# Patient Record
Sex: Male | Born: 1950 | Race: Black or African American | Hispanic: No | Marital: Married | Smoking: Former smoker
Health system: Southern US, Community
[De-identification: ages and names within clinical notes are randomized; demographics above are authoritative.]

## PROBLEM LIST (undated history)

## (undated) DIAGNOSIS — F1910 Other psychoactive substance abuse, uncomplicated: Secondary | ICD-10-CM

## (undated) DIAGNOSIS — I10 Essential (primary) hypertension: Secondary | ICD-10-CM

## (undated) DIAGNOSIS — E119 Type 2 diabetes mellitus without complications: Secondary | ICD-10-CM

## (undated) DIAGNOSIS — E7800 Pure hypercholesterolemia, unspecified: Secondary | ICD-10-CM

## (undated) DIAGNOSIS — I639 Cerebral infarction, unspecified: Secondary | ICD-10-CM

## (undated) DIAGNOSIS — I739 Peripheral vascular disease, unspecified: Secondary | ICD-10-CM

## (undated) HISTORY — PX: FEMORAL ARTERY STENT: SHX1583

---

## 2020-06-18 ENCOUNTER — Encounter: Payer: Self-pay | Admitting: Emergency Medicine

## 2020-06-18 ENCOUNTER — Observation Stay: Payer: Medicare Other

## 2020-06-18 ENCOUNTER — Other Ambulatory Visit: Payer: Self-pay

## 2020-06-18 ENCOUNTER — Inpatient Hospital Stay
Admission: EM | Admit: 2020-06-18 | Discharge: 2020-06-21 | DRG: 064 | Disposition: A | Discharge status: Home / Self Care | Payer: Medicare Other | Attending: Internal Medicine | Admitting: Internal Medicine

## 2020-06-18 ENCOUNTER — Emergency Department: Payer: Medicare Other

## 2020-06-18 DIAGNOSIS — E7800 Pure hypercholesterolemia, unspecified: Secondary | ICD-10-CM | POA: Diagnosis present

## 2020-06-18 DIAGNOSIS — Z7902 Long term (current) use of antithrombotics/antiplatelets: Secondary | ICD-10-CM

## 2020-06-18 DIAGNOSIS — G9341 Metabolic encephalopathy: Secondary | ICD-10-CM | POA: Diagnosis present

## 2020-06-18 DIAGNOSIS — R29706 NIHSS score 6: Secondary | ICD-10-CM | POA: Diagnosis present

## 2020-06-18 DIAGNOSIS — R739 Hyperglycemia, unspecified: Secondary | ICD-10-CM

## 2020-06-18 DIAGNOSIS — G8321 Monoplegia of upper limb affecting right dominant side: Secondary | ICD-10-CM | POA: Diagnosis present

## 2020-06-18 DIAGNOSIS — R2990 Unspecified symptoms and signs involving the nervous system: Secondary | ICD-10-CM

## 2020-06-18 DIAGNOSIS — F1210 Cannabis abuse, uncomplicated: Secondary | ICD-10-CM | POA: Diagnosis present

## 2020-06-18 DIAGNOSIS — Z79899 Other long term (current) drug therapy: Secondary | ICD-10-CM

## 2020-06-18 DIAGNOSIS — E1169 Type 2 diabetes mellitus with other specified complication: Secondary | ICD-10-CM | POA: Diagnosis present

## 2020-06-18 DIAGNOSIS — Z9114 Patient's other noncompliance with medication regimen: Secondary | ICD-10-CM

## 2020-06-18 DIAGNOSIS — R471 Dysarthria and anarthria: Secondary | ICD-10-CM | POA: Diagnosis present

## 2020-06-18 DIAGNOSIS — Z7151 Drug abuse counseling and surveillance of drug abuser: Secondary | ICD-10-CM

## 2020-06-18 DIAGNOSIS — F1410 Cocaine abuse, uncomplicated: Secondary | ICD-10-CM | POA: Diagnosis present

## 2020-06-18 DIAGNOSIS — I639 Cerebral infarction, unspecified: Secondary | ICD-10-CM

## 2020-06-18 DIAGNOSIS — Z20822 Contact with and (suspected) exposure to covid-19: Secondary | ICD-10-CM | POA: Diagnosis present

## 2020-06-18 DIAGNOSIS — Z87891 Personal history of nicotine dependence: Secondary | ICD-10-CM

## 2020-06-18 DIAGNOSIS — I10 Essential (primary) hypertension: Secondary | ICD-10-CM | POA: Diagnosis not present

## 2020-06-18 DIAGNOSIS — F1010 Alcohol abuse, uncomplicated: Secondary | ICD-10-CM | POA: Diagnosis present

## 2020-06-18 DIAGNOSIS — E1165 Type 2 diabetes mellitus with hyperglycemia: Secondary | ICD-10-CM | POA: Diagnosis present

## 2020-06-18 DIAGNOSIS — E1151 Type 2 diabetes mellitus with diabetic peripheral angiopathy without gangrene: Secondary | ICD-10-CM | POA: Diagnosis present

## 2020-06-18 DIAGNOSIS — I700 Atherosclerosis of aorta: Secondary | ICD-10-CM | POA: Diagnosis present

## 2020-06-18 DIAGNOSIS — I672 Cerebral atherosclerosis: Secondary | ICD-10-CM | POA: Diagnosis present

## 2020-06-18 DIAGNOSIS — I63412 Cerebral infarction due to embolism of left middle cerebral artery: Secondary | ICD-10-CM | POA: Diagnosis not present

## 2020-06-18 DIAGNOSIS — H9190 Unspecified hearing loss, unspecified ear: Secondary | ICD-10-CM | POA: Diagnosis present

## 2020-06-18 DIAGNOSIS — F1011 Alcohol abuse, in remission: Secondary | ICD-10-CM | POA: Diagnosis present

## 2020-06-18 DIAGNOSIS — I739 Peripheral vascular disease, unspecified: Secondary | ICD-10-CM | POA: Diagnosis present

## 2020-06-18 DIAGNOSIS — R4701 Aphasia: Secondary | ICD-10-CM | POA: Diagnosis present

## 2020-06-18 DIAGNOSIS — E876 Hypokalemia: Secondary | ICD-10-CM | POA: Diagnosis present

## 2020-06-18 DIAGNOSIS — N400 Enlarged prostate without lower urinary tract symptoms: Secondary | ICD-10-CM | POA: Diagnosis present

## 2020-06-18 DIAGNOSIS — Z87438 Personal history of other diseases of male genital organs: Secondary | ICD-10-CM | POA: Diagnosis present

## 2020-06-18 DIAGNOSIS — E785 Hyperlipidemia, unspecified: Secondary | ICD-10-CM | POA: Diagnosis present

## 2020-06-18 HISTORY — DX: Pure hypercholesterolemia, unspecified: E78.00

## 2020-06-18 HISTORY — DX: Essential (primary) hypertension: I10

## 2020-06-18 HISTORY — DX: Peripheral vascular disease, unspecified: I73.9

## 2020-06-18 LAB — CBG MONITORING, ED
Glucose-Capillary: 189 mg/dL — ABNORMAL HIGH (ref 70–99)
Glucose-Capillary: 110 mg/dL — ABNORMAL HIGH (ref 70–99)

## 2020-06-18 LAB — COMPREHENSIVE METABOLIC PANEL
ALT: 20 U/L (ref 0–44)
AST: 24 U/L (ref 15–41)
Albumin: 4.5 g/dL (ref 3.5–5.0)
Alkaline Phosphatase: 52 U/L (ref 38–126)
Anion gap: 9 (ref 5–15)
BUN: 19 mg/dL (ref 8–23)
CO2: 24 mmol/L (ref 22–32)
Calcium: 9.3 mg/dL (ref 8.9–10.3)
Chloride: 104 mmol/L (ref 98–111)
Creatinine, Ser: 1.02 mg/dL (ref 0.61–1.24)
GFR, Estimated: >60 mL/min (ref >60)
Glucose, Bld: 211 mg/dL — ABNORMAL HIGH (ref 70–99)
Potassium: 3.7 mmol/L (ref 3.5–5.1)
Sodium: 137 mmol/L (ref 135–145)
Total Bilirubin: 0.8 mg/dL (ref 0.3–1.2)
Total Protein: 7.1 g/dL (ref 6.5–8.1)

## 2020-06-18 LAB — DIFFERENTIAL
Abs Immature Granulocytes: 0 10*3/uL (ref 0.00–0.07)
Basophils Absolute: 0 10*3/uL (ref 0.0–0.1)
Basophils Relative: 1 %
Eosinophils Absolute: 0.1 10*3/uL (ref 0.0–0.5)
Eosinophils Relative: 2 %
Immature Granulocytes: 0 %
Lymphocytes Relative: 16 %
Lymphs Abs: 0.6 10*3/uL — ABNORMAL LOW (ref 0.7–4.0)
Monocytes Absolute: 0.5 10*3/uL (ref 0.1–1.0)
Monocytes Relative: 13 %
Neutro Abs: 2.6 10*3/uL (ref 1.7–7.7)
Neutrophils Relative %: 68 %

## 2020-06-18 LAB — RESP PANEL BY RT-PCR (FLU A&B, COVID) ARPGX2
Influenza A by PCR: NEGATIVE
Influenza B by PCR: NEGATIVE
SARS Coronavirus 2 by RT PCR: NEGATIVE

## 2020-06-18 LAB — APTT: aPTT: 25 seconds (ref 24–36)

## 2020-06-18 LAB — PROTIME-INR
INR: 1.1 (ref 0.8–1.2)
Prothrombin Time: 13.4 seconds (ref 11.4–15.2)

## 2020-06-18 LAB — MAGNESIUM: Magnesium: 2 mg/dL (ref 1.7–2.4)

## 2020-06-18 LAB — GLUCOSE, CAPILLARY: Glucose-Capillary: 111 mg/dL — ABNORMAL HIGH (ref 70–99)

## 2020-06-18 LAB — CBC
HCT: 46.1 % (ref 39.0–52.0)
Hemoglobin: 14.9 g/dL (ref 13.0–17.0)
MCH: 28.8 pg (ref 26.0–34.0)
MCHC: 32.3 g/dL (ref 30.0–36.0)
MCV: 89.2 fL (ref 80.0–100.0)
Platelets: 194 10*3/uL (ref 150–400)
RBC: 5.17 MIL/uL (ref 4.22–5.81)
RDW: 12.5 % (ref 11.5–15.5)
WBC: 3.7 10*3/uL — ABNORMAL LOW (ref 4.0–10.5)
nRBC: 0 % (ref 0.0–0.2)

## 2020-06-18 LAB — PHOSPHORUS: Phosphorus: 3.6 mg/dL (ref 2.5–4.6)

## 2020-06-18 LAB — URINALYSIS, COMPLETE (UACMP) WITH MICROSCOPIC
Bacteria, UA: NONE SEEN
Bilirubin Urine: NEGATIVE
Glucose, UA: >=500 mg/dL — AB
Hgb urine dipstick: NEGATIVE
Ketones, ur: NEGATIVE mg/dL
Leukocytes,Ua: NEGATIVE
Nitrite: NEGATIVE
Protein, ur: NEGATIVE mg/dL
Specific Gravity, Urine: 1.025 (ref 1.005–1.030)
pH: 5 (ref 5.0–8.0)

## 2020-06-18 LAB — TROPONIN I (HIGH SENSITIVITY)
Troponin I (High Sensitivity): 14 ng/L (ref <18)
Troponin I (High Sensitivity): 17 ng/L (ref <18)

## 2020-06-18 LAB — ETHANOL: Alcohol, Ethyl (B): <10 mg/dL (ref <10)

## 2020-06-18 LAB — HEMOGLOBIN A1C
Hgb A1c MFr Bld: 7.5 % — ABNORMAL HIGH (ref 4.8–5.6)
Mean Plasma Glucose: 168.55 mg/dL

## 2020-06-18 MED ORDER — FOLIC ACID 1 MG PO TABS
1.0000 mg | ORAL_TABLET | Freq: Every day | ORAL | Status: DC
  Administered 2020-06-18 – 2020-06-21 (×4): 1 mg via ORAL
  Filled 2020-06-18 (×4): qty 1

## 2020-06-18 MED ORDER — SENNOSIDES-DOCUSATE SODIUM 8.6-50 MG PO TABS
1.0000 | ORAL_TABLET | Freq: Two times a day (BID) | ORAL | Status: DC
  Administered 2020-06-18 – 2020-06-21 (×7): 1 via ORAL
  Filled 2020-06-18 (×6): qty 1

## 2020-06-18 MED ORDER — LORAZEPAM 2 MG/ML IJ SOLN: 1.0000 mg | INTRAMUSCULAR | Status: DC | PRN

## 2020-06-18 MED ORDER — ACETAMINOPHEN 325 MG PO TABS: 325.0000 mg | ORAL_TABLET | ORAL | Status: DC | PRN

## 2020-06-18 MED ORDER — ATORVASTATIN CALCIUM 20 MG PO TABS
80.0000 mg | ORAL_TABLET | Freq: Every day | ORAL | Status: DC
  Administered 2020-06-18 – 2020-06-21 (×4): 80 mg via ORAL
  Filled 2020-06-18 (×4): qty 4

## 2020-06-18 MED ORDER — ADULT MULTIVITAMIN W/MINERALS CH
1.0000 | ORAL_TABLET | Freq: Every day | ORAL | Status: DC
  Administered 2020-06-19 – 2020-06-21 (×3): 1 via ORAL
  Filled 2020-06-18 (×3): qty 1

## 2020-06-18 MED ORDER — LABETALOL HCL 5 MG/ML IV SOLN: 5.0000 mg | INTRAVENOUS | Status: DC | PRN

## 2020-06-18 MED ORDER — THIAMINE HCL 100 MG PO TABS
100.0000 mg | ORAL_TABLET | Freq: Every day | ORAL | Status: DC
  Administered 2020-06-18 – 2020-06-21 (×4): 100 mg via ORAL
  Filled 2020-06-18 (×4): qty 1

## 2020-06-18 MED ORDER — ACETAMINOPHEN 650 MG RE SUPP: 650.0000 mg | RECTAL | Status: DC | PRN

## 2020-06-18 MED ORDER — ACETAMINOPHEN 160 MG/5ML PO SOLN
650.0000 mg | ORAL | Status: DC | PRN
  Filled 2020-06-18: qty 20.3

## 2020-06-18 MED ORDER — STROKE: EARLY STAGES OF RECOVERY BOOK
Freq: Once | Status: DC
  Filled 2020-06-18: qty 1

## 2020-06-18 MED ORDER — INSULIN ASPART 100 UNIT/ML ~~LOC~~ SOLN
0.0000 [IU] | SUBCUTANEOUS | Status: DC
  Administered 2020-06-19 – 2020-06-21 (×6): 2 [IU] via SUBCUTANEOUS
  Filled 2020-06-18 (×6): qty 1

## 2020-06-18 MED ORDER — CLOPIDOGREL BISULFATE 75 MG PO TABS: 75.0000 mg | ORAL_TABLET | Freq: Every day | ORAL | Status: DC

## 2020-06-18 MED ORDER — THIAMINE HCL 100 MG/ML IJ SOLN: 100.0000 mg | Freq: Every day | INTRAMUSCULAR | Status: DC

## 2020-06-18 MED ORDER — ASPIRIN 300 MG RE SUPP
300.0000 mg | Freq: Every day | RECTAL | Status: DC
  Filled 2020-06-18 (×3): qty 1

## 2020-06-18 MED ORDER — ASPIRIN 81 MG PO CHEW
81.0000 mg | CHEWABLE_TABLET | Freq: Every day | ORAL | Status: DC
  Administered 2020-06-18 – 2020-06-21 (×4): 81 mg via ORAL
  Filled 2020-06-18 (×4): qty 1

## 2020-06-18 MED ORDER — IOHEXOL 350 MG/ML SOLN
75.0000 mL | Freq: Once | INTRAVENOUS | Status: AC | PRN
  Administered 2020-06-18: 75 mL via INTRAVENOUS

## 2020-06-18 MED ORDER — THIAMINE HCL 100 MG/ML IJ SOLN
100.0000 mg | Freq: Once | INTRAMUSCULAR | Status: AC
  Administered 2020-06-18: 100 mg via INTRAVENOUS
  Filled 2020-06-18: qty 2

## 2020-06-18 MED ORDER — LORAZEPAM 1 MG PO TABS
1.0000 mg | ORAL_TABLET | ORAL | Status: DC | PRN
  Administered 2020-06-19: 1 mg via ORAL
  Filled 2020-06-18: qty 1

## 2020-06-18 NOTE — ED Triage Notes (Signed)
Pt to ED via POV with wife, pt's wife states symptoms started Friday, pt's wife reports LKW was Wednesday, noted symptoms on Friday. Pt's wife reports episodes where patient does not understand what she is saying, also reports some difficulty using R arm.   Pt with some noted confusion when asked to perform tasks, no drift noted, grip strength equal bilaterally, no numbness when touched, pt alert and oriented to place, month, disoriented to year.

## 2020-06-18 NOTE — TOC Initial Note (Signed)
Transition of Care [facility]) - Initial/Assessment Note    Patient Details  Name: Micheal Owens MRN: [id number] Date of Birth: [birth year]  [facility] CM/SW Contact:    Berenice Bouton, LCSW Phone Number: 06/18/2020, 4:14 PM  Clinical Narrative:    CSW met with patient at bedside to discussed discharge plan. Social worker explained to the patient reason for the consult. CSW explain HIPPA.  Substance abuse counseling.   The patient is a 70 year old male who presented to [facility] ED with complaints of confusion and right upper extremity weakness. Wife drove patient to the hospital via private vehicle. Patient self-disclosed current use of crack cocaine, marijuana use, and alcohol consumption.  Social worker collaborated with patient's wife who noted that the patient is currently under the care [facility].  Has follow-up appointment with PCP on 06/28/2020.   Patient was provided with the following resources: [facility] for Substance Abuse and [place] [facility] [employer]. Substance abuse counseling provided along with risk factors associated with substance use and total health.  Patient's status is Obs. CSW will continue to follow this patient should further needs arise.                Expected Discharge Plan: Home/Self Care Barriers to Discharge: Continued Medical Work up   Patient Goals and CMS Choice Patient states their goals for this hospitalization and ongoing recovery are:: "Go home to my wife."      Expected Discharge Plan and Services Expected Discharge Plan: Home/Self Care       Living arrangements for the past 2 months: Single Family Home                           Prior Living Arrangements/Services Living arrangements for the past 2 months: Single Family Home   Patient language and need for interpreter reviewed:: No Do you feel safe going back to the place where you live?: Yes      Need for Family Participation in  Patient Care: No (Comment) Care giver support system in place?: Yes (comment)   Criminal Activity/Legal Involvement Pertinent to Current Situation/Hospitalization: No - Comment as needed  Activities of Daily Living      Permission Sought/Granted Permission sought to share information with : Case Manager,Facility Contact Representative,Family Supports Permission granted to share information with : Yes, Verbal Permission Granted  Share Information with NAME: Destine Ambroise [phone]           Emotional Assessment Appearance:: Appears older than stated age Attitude/Demeanor/Rapport: Gracious Affect (typically observed): Accepting Orientation: : Oriented to Place,Oriented to Self,Oriented to Situation Alcohol / Substance Use: Alcohol Use,Illicit Drugs (Crack cocaine, alcohol, marijuana) Psych Involvement: No (comment)  Admission diagnosis:  Hyperglycemia [R73.9] Stroke [facility]) [I63.9] Dysarthria [R47.1] Cerebrovascular accident (CVA), unspecified mechanism ([place]) [I63.9] Patient Active Problem List   Diagnosis Date Noted  . HTN (hypertension) 06/18/2020  . Type 2 diabetes mellitus with hyperlipidemia ([place]) 06/18/2020  . PAD (peripheral artery disease) ([place]) 06/18/2020  . History of BPH 06/18/2020  . History of alcohol abuse 06/18/2020  . Stroke-like episode 06/18/2020  . Stroke [facility]) 06/18/2020   PCP:  Healthcare, [place]:   [id number] [id number]: [phone]: [phone]    Social Determinants of Health (SDOH) Interventions    Readmission Risk Interventions No flowsheet data found.

## 2020-06-18 NOTE — Consult Note (Signed)
[facility] Neurology Consultation  Reason for Consult: stroke Referring Physician: Dr Sedalia Muta, [facility] hospitalist  CC: Confusion, right arm weakness  History is obtained from: Patient's wife, chart  HPI: Micheal Owens is a 70 y.o. male-medical history of DM2, hypercholesterolemia, hypertension, peripheral vascular disease on Plavix at home, with unknown compliance, alcohol abuse and cocaine abuse, presenting for evaluation of confusion and right-sided weakness with last known well at some point Thursday, 06/15/2020. The wife reports that he is retired, drinks a lot of alcohol-at least 1 pint of liquor with some beer as well every day and he she recently found out that he also has been doing cocaine.  He appears to be off of his baseline to her because he is acting confused, his words not making sense.  This started to become very noticeable sometime on Thursday, 06/15/2020 but he would refuse to come to the hospital.  Today after church, she drove her to the hospital for evaluation.  She also reports that he told her that he had stopped drinking alcohol cold [place] 1 to 2 weeks ago.  She does not know if that is really true.  No reported seizures. He is unable to provide reliable history.  Reason the wife brought him today was the confusion and word finding issues as well as she also felt that he was unable to use his right side as well as the left side.   LKW: Unclear-advanced sometime on Thursday, 06/15/2020 tpa given?: no, outside the window Premorbid modified Rankin scale (mRS): 0  ROS: Performed and negative except as noted in HPI  Past Medical History:  Diagnosis Date  . High cholesterol   . Hypertension   . PVD (peripheral vascular disease) (HCC)   Diabetes type 2  No family history on file.  Social History:   reports that he has quit smoking. He has never used smokeless tobacco. He reports previous alcohol use. He reports previous drug use.  Medications  Current Facility-Administered  Medications:  .   stroke: mapping our early stages of recovery book, , Does not apply, Once, Cox, Amy N, DO .  acetaminophen (TYLENOL) tablet 325 mg, 325 mg, Oral, Q4H PRN **OR** acetaminophen (TYLENOL) 160 MG/5ML solution 650 mg, 650 mg, Per Tube, Q4H PRN **OR** acetaminophen (TYLENOL) suppository 650 mg, 650 mg, Rectal, Q4H PRN, Cox, Amy N, DO .  insulin aspart (novoLOG) injection 0-15 Units, 0-15 Units, Subcutaneous, Q4H, Cox, Amy N, DO .  senna-docusate (Senokot-S) tablet 1 tablet, 1 tablet, Oral, BID, Cox, Amy N, DO No current outpatient medications on file. Complete list of home medications not available but wife had a few medications on her which included atorvastatin, Plavix 75, and amlodipine.  Care everywhere review also shows that he is on lisinopril, sildenafil, tamsulosin 0.4 nightly  Exam: Current vital signs: BP (!) 148/86 (BP Location: Left Arm)   Pulse 70   Temp 98.1 F (36.7 C) (Oral)   Resp 20   Ht 5\' 8"  (1.727 m)   Wt 84.8 kg   SpO2 97%   BMI 28.43 kg/m  Vital signs in last 24 hours: Temp:  [98.1 F (36.7 C)] 98.1 F (36.7 C) (03/13 1214) Pulse Rate:  [70] 70 (03/13 1214) Resp:  [20] 20 (03/13 1214) BP: (148)/(86) 148/86 (03/13 1214) SpO2:  [97 %] 97 % (03/13 1214) Weight:  [84.8 kg] 84.8 kg (03/13 1215) General: Awake alert in no distress HEENT: Normocephalic atraumatic Lungs: Clear Cardiovascular: Regular rhythm Abdomen soft nondistended nontender Extremities warm well perfused with  no edema Neurological exam Is awake alert oriented to self. He was able to say his name. On asking his age that she said 24. He was unable to name most of the objects on the stroke cards with the exception of chair and keep. He was unable to describe the picture and had multiple word substitutions and inability to grasp the context. He was able to follow simple commands and mimic. Cranial nerves: Pupils equal round react light, extract movements intact, visual fields full,  face appears grossly symmetric with questionable mild right-sided nasolabial fold flattening, auditory acuity diminished bilaterally, tongue deviates to the right on protruding completely out.  Palate midline. Motor exam: All 4 extremities are antigravity without drift but he does have pronator drift on the right upper extremity.  Strength in all 4 extremities is nearly symmetric. Sensory exam: Intact to touch Coordination: No dysmetria Gait testing deferred NIH stroke scale 1a Level of Conscious.: 0 1b LOC Questions: 2 1c LOC Commands: 0 2 Best Gaze: 0 3 Visual: 0 4 Facial Palsy: 1 5a Motor Arm - left: 0 5b Motor Arm - Right: 0 6a Motor Leg - Left: 0 6b Motor Leg - Right: 0 7 Limb Ataxia: 0 8 Sensory: 0 9 Best Language: 2 10 Dysarthria: 1 11 Extinct. and Inatten.: 0 TOTAL: 6  Labs I have reviewed labs in epic and the results pertinent to this consultation are:  CBC    Component Value Date/Time   WBC 3.7 (L) 06/18/2020 1227   RBC 5.17 06/18/2020 1227   HGB 14.9 06/18/2020 1227   HCT 46.1 06/18/2020 1227   PLT 194 06/18/2020 1227   MCV 89.2 06/18/2020 1227   MCH 28.8 06/18/2020 1227   MCHC 32.3 06/18/2020 1227   RDW 12.5 06/18/2020 1227   LYMPHSABS 0.6 (L) 06/18/2020 1227   MONOABS 0.5 06/18/2020 1227   EOSABS 0.1 06/18/2020 1227   BASOSABS 0.0 06/18/2020 1227   CMP     Component Value Date/Time   NA 137 06/18/2020 1227   K 3.7 06/18/2020 1227   CL 104 06/18/2020 1227   CO2 24 06/18/2020 1227   GLUCOSE 211 (H) 06/18/2020 1227   BUN 19 06/18/2020 1227   CREATININE 1.02 06/18/2020 1227   CALCIUM 9.3 06/18/2020 1227   PROT 7.1 06/18/2020 1227   ALBUMIN 4.5 06/18/2020 1227   AST 24 06/18/2020 1227   ALT 20 06/18/2020 1227   ALKPHOS 52 06/18/2020 1227   BILITOT 0.8 06/18/2020 1227   GFRNONAA >60 06/18/2020 1227   Imaging I have reviewed the images obtained:  CT-scan of the brain-noncontrast-hypodensities indicating potentially subacute infarct in the mid  left frontal mid left occipital and superior posterior right centrum semiovale.  The left-sided distribution seems more watershed rather than [employer] radiology read is more concerning for embolic phenomenon given the right hemispheric involvement as well which I do not completely disagree with.  Assessment:  70 year old man with above past medical history including depressiveness abuse, hypertension, hyperlipidemia, and alcohol abuse presenting for 2 to 3 days worth of increasing confusion and difficulty with forming his words along with since Thursday, more difficulty using his right hand. On my exam he is definitely more expressively aphasic and receptively aphasic. He does have subtle right nasolabial fold flattening and right upper extremity pronator drift. CT head concerning for embolic-looking infarcts but to me the appearance of the left-sided distribution could be a watershed pattern rather than embolic.  There are definitely right-sided hypodensities as well which could point  to embolic etiology. Given his cocaine use, embolic etiology versus vasospasm is also likely. Concomitant small vessel disease due to risk factors could also be the etiology. Last but not the least-given polysubstance abuse history, concern for septic emboli also should remain. Other differentials could include Wernicke's given heavy alcohol use and recent history of possible discontinuation.  Impression:  Acute ischemic stroke-evaluate for cardioembolic versus atheroembolic versus large vessel occlusion etiology  Not a candidate for TPA or intervention due to last known normal being at least 3 days ago.  Polysubstance abuse  Hypertension, hyperlipidemia  Alcohol abuse-recently quit-no reported seizure activity.   Recommendations:  Admit to hospitalist  Frequent neurochecks  MRI of the brain without contrast stat  CTA head and neck stat to look for left carotid stenosis or occlusion-which  would favor watershed etiology of strokes.  2D echo  A1c  Lipid panel  For now aspirin 81 only.  Given his polysubstance abuse history, this is somewhat related to septic emboli or infective endocarditis, he would be at high risk for bleed if he is on dual antiplatelets or anticoagulants.  CTA above to evaluate for mycotic aneurysms as well  Based on vessel imaging, prophylactic antiplatelet treatment would be recommended.  If atrial fibrillation is revealed on telemetry, will need anticoagulation in the long run  N.p.o. until cleared by bedside swallow valuation  PT/OT/speech therapy  CIWA protocol  Check thiamine and then replete high-dose thiamine IV followed by PO  U tox  Inpatient neurology will follow with you  -- Milon Dikes, MD Neurologist [facility] Pager: [phone]

## 2020-06-18 NOTE — Progress Notes (Signed)
Pt arrived to 1A from ED. VS obtained and in the chart. Pt oriented to room and call bell is in reach. All questions and concerns answered at this time.

## 2020-06-18 NOTE — ED Provider Notes (Signed)
[facility] Emergency Department Provider Note  ____________________________________________   Event Date/Time   First MD Initiated Contact with Patient 06/18/20 1305     (approximate)  I have reviewed the triage vital signs and the nursing notes.   HISTORY  Chief Complaint Altered Mental Status   HPI Micheal Owens is a 70 y.o. male with a past medical history of HTN, HDL, PVD on Plavix and tobacco and THC use who presents accompanied by wife for assessment of some weakness in his right arm and difficulty with speech that his wife first noticed on 3 01/08/2010 she is not exactly sure.  Patient denies any acute pain or recent injuries although is unclear how reliable his history is as he is unable to state the year.  Patient's wife is at bedside and states this is very abnormal for him.  She has not witnessed any falls or injuries, cough, shortness of breath, vomiting, diarrhea and does not think the patient has been complaining of any other sick symptoms.  No other history is immediately available on arrival.  No history of stroke.         Past Medical History:  Diagnosis Date  . High cholesterol   . Hypertension   . PVD (peripheral vascular disease) [facility])     Patient Active Problem List   Diagnosis Date Noted  . HTN (hypertension) 06/18/2020  . Type 2 diabetes mellitus with hyperlipidemia (HCC) 06/18/2020  . PAD (peripheral artery disease) (HCC) 06/18/2020  . History of BPH 06/18/2020  . History of alcohol abuse 06/18/2020  . Stroke-like episode 06/18/2020  . Stroke (HCC) 06/18/2020    Prior to Admission medications   Not on File    Allergies Patient has no known allergies.  No family history on file.  Social History Social History   Tobacco Use  . Smoking status: Former Games developer  . Smokeless tobacco: Never Used  Substance Use Topics  . Alcohol use: Not Currently  . Drug use: Not Currently    Review of Systems  Review of Systems   Unable to perform ROS: Mental status change  Neurological: Positive for focal weakness ( R arm).      ____________________________________________   PHYSICAL EXAM:  VITAL SIGNS: ED Triage Vitals  Enc Vitals Group     BP 06/18/20 1214 (!) 148/86     Pulse Rate 06/18/20 1214 70     Resp 06/18/20 1214 20     Temp 06/18/20 1214 98.1 F (36.7 C)     Temp Source 06/18/20 1214 Oral     SpO2 06/18/20 1214 97 %     Weight 06/18/20 1215 187 lb (84.8 kg)     Height 06/18/20 1215 5\' 8"  (1.727 m)     Head Circumference --      Peak Flow --      Pain Score 06/18/20 1214 0     Pain Loc --      Pain Edu? --      Excl. in GC? --    Vitals:   06/18/20 1214  BP: (!) 148/86  Pulse: 70  Resp: 20  Temp: 98.1 F (36.7 C)  SpO2: 97%   Physical Exam Vitals and nursing note reviewed.  Constitutional:      Appearance: He is well-developed.  HENT:     Head: Normocephalic and atraumatic.     Right Ear: External ear normal.     Left Ear: External ear normal.     Nose: Nose normal.  Eyes:     Conjunctiva/sclera: Conjunctivae normal.  Cardiovascular:     Rate and Rhythm: Normal rate and regular rhythm.     Heart sounds: No murmur heard.   Pulmonary:     Effort: Pulmonary effort is normal. No respiratory distress.     Breath sounds: Normal breath sounds.  Abdominal:     Palpations: Abdomen is soft.     Tenderness: There is no abdominal tenderness.  Musculoskeletal:     Cervical back: Neck supple.  Skin:    General: Skin is warm and dry.  Neurological:     Mental Status: He is alert. He is confused.     Cranial Nerves: Dysarthria present.  Psychiatric:        Mood and Affect: Mood normal.     No clear finger dysmetria or pronator drift although patient is unable to hold his palm facing upwards on testing pronator drifting.  He seems to have symmetric strength in his bilateral upper extremities and has symmetric strength in his lower extremities.  Sensation is intact to light  touch lower extremities.  PERRLA.  EOMI. ____________________________________________   LABS (all labs ordered are listed, but only abnormal results are displayed)  Labs Reviewed  CBC - Abnormal; Notable for the following components:      Result Value   WBC 3.7 (*)    All other components within normal limits  DIFFERENTIAL - Abnormal; Notable for the following components:   Lymphs Abs 0.6 (*)    All other components within normal limits  COMPREHENSIVE METABOLIC PANEL - Abnormal; Notable for the following components:   Glucose, Bld 211 (*)    All other components within normal limits  CBG MONITORING, ED - Abnormal; Notable for the following components:   Glucose-Capillary 189 (*)    All other components within normal limits  RESP PANEL BY RT-PCR (FLU A&B, COVID) ARPGX2  PROTIME-INR  APTT  HEMOGLOBIN A1C  URINALYSIS, COMPLETE (UACMP) WITH MICROSCOPIC  ETHANOL  URINE DRUG SCREEN, QUALITATIVE ([facility] ONLY)  URINALYSIS, ROUTINE W REFLEX MICROSCOPIC  TROPONIN I (HIGH SENSITIVITY)   ____________________________________________  EKG  Sinus rhythm with ventricular rate of 80, normal axis, unremarkable intervals and some nonspecific ST changes in leads II, aVF, V3, V4, and V5. ____________________________________________  RADIOLOGY  ED MD interpretation: CT head shows no evidence of acute intracranial hemorrhage but does show evidence of multiple likely acute ischemic infarcts.  Official radiology report(s): CT HEAD WO CONTRAST  Result Date: 06/18/2020 CLINICAL DATA:  Altered mental status EXAM: CT HEAD WITHOUT CONTRAST TECHNIQUE: Contiguous axial images were obtained from the base of the skull through the vertex without intravenous contrast. COMPARISON:  None. FINDINGS: Brain: Ventricles and sulci are normal in size and configuration. There is no intracranial mass, hemorrhage, extra-axial fluid collection, midline shift. There is decreased attenuation in the mid left frontal lobe  consistent with a recent and potentially acute infarct. A similar appearing area of decreased attenuation is noted in the mid left occipital lobe, concerning for recent and potentially acute infarct. There is a focus of decreased attenuation in the posterior right centrum semiovale which may represent a recent and possibly acute white matter infarct in this area. Vascular: No hyperdense vessel. There is calcification in each distal vertebral artery and carotid siphon region. Skull: Bony calvarium appears intact. Sinuses/Orbits: Mucosal thickening noted in several ethmoid air cells and in the anterior sphenoid sinus regions. Visualized orbits appear symmetric bilaterally. Other: Mastoid air cells are clear. IMPRESSION: Concern for acute infarcts in  the mid left frontal, mid left occipital, and superior posterior right centrum semiovale. This distribution of potential acute infarcts suggests underlying embolic phenomenon. No mass or hemorrhage evident. Multiple foci of arterial vascular calcification noted. There is mucosal thickening in several paranasal sinus regions. Electronically Signed   By: Bretta Bang III M.D.   On: 06/18/2020 12:56    ____________________________________________   PROCEDURES  Procedure(s) performed (including Critical Care):  .1-3 Lead EKG Interpretation Performed by: Gilles Chiquito, MD Authorized by: Gilles Chiquito, MD     Interpretation: normal     ECG rate assessment: normal     Rhythm: sinus rhythm     Ectopy: none     Conduction: normal       ____________________________________________   INITIAL IMPRESSION / ASSESSMENT AND PLAN / ED COURSE     Patient presents with above history exam for assessment of increased confusion and difficulty with words as well as reportedly some weakness in his right arm that patient's wife thinks began either on the 10th and 11th although patient is not sure when it began.  He is confused on arrival and unable provide  any additional significant history.  Per wife no other recent sick symptoms.  He is stable and afebrile on arrival.  He is confused and unable to hold his palm place up on pronator drift testing otherwise did not have any clear evidence of focal deficits on exam.  CT head obtained in triage shows no evidence of tracheal hemorrhage but does show evidence of likely several ischemic infarct.  Patient denies any chest pain minimal EKG has some nonspecific changes given nonelevated troponin and low suspicion for ACS.  CMP remarkable for glucose of 211 with no other significant electrolyte or metabolic derangements that would explain patient's presentation.  CBC shows WBC count of 3.7 with no anemia or thrombocytopenia.  INR is unremarkable.  I suspect patient's presentation is likely driven largely by acute ischemic infarct his last known well was greater than 24 hours prior to arrival and is not a candidate for TPA or emergent admission at this time.  Vessel imaging and MRI brain ordered.  We will also plan to obtain UA ethanol and UDS to rule out other causes of patient's confusion.  I will plan to admit to medicine service for further evaluation and management.       ____________________________________________   FINAL CLINICAL IMPRESSION(S) / ED DIAGNOSES  Final diagnoses:  Cerebrovascular accident (CVA), unspecified mechanism (HCC)  Dysarthria  Hyperglycemia    Medications  insulin aspart (novoLOG) injection 0-15 Units (has no administration in time range)     ED Discharge Orders    None       Note:  This document was prepared using Dragon voice recognition software and may include unintentional dictation errors.   Gilles Chiquito, MD 06/18/20 [phone]

## 2020-06-18 NOTE — H&P (Addendum)
History and Physical   [name] NTZ:[id number] DOB: 1951-04-02 DOA: 06/18/2020  [facility]  Patient coming from: Home via POV  I have personally briefly reviewed patient's old medical records in [facility] EMR.  Chief Concern: confusion and right upper extremity weakness  HPI: Micheal Owens is a 70 y.o. right-handed male with medical history significant for hypertension, hyperlipidemia, non-insulin-dependent diabetes mellitus, PAD, previous daily alcohol use, polysubstance abuse including crack cocaine and THC, presented to the emergency department for chief concerns of confusion and right upper extremity weakness via private vehicle, spouse.  Micheal Owens at bedside was able to tell me his name, age with encouragement (initially 53 and debated for a while and after I told him that he is not in his 17s, he correctly said 19), he told me the year was 2002, and that he is in the hospital.  He was able to identify his wife at bedside.  Patient does not know why he is in the hospital.  He does endorse right upper extremity weakness.  He reports that he has never felt this way before.  He denies history of strokes, seizures, DTs.  Per spouse at bedside, she endorsed that he had difficulty understanding his spouse since Thursday, 06/15/20.   He denies tobacco use. He uses crack cocaine, smoking twice per week (last time was Friday, 06/16/2020). He smokes marijuana every day, last used marijuana with 06/18/2020, prior to coming to the hospital.    Social history: He worked in [employer] and is currently retired.  [place], he endorses drinking 1 pint of etoh per day and quit 2 weeks ago. He has not had a drop of etoh for two weeks.  He denies history of tobacco use.  He endorses using crack cocaine and THC as above.  Vaccination: Per spouse, Micheal Owens is vaccinated with Moderna for three doses.  I was not able to find records of these in the E HR.  On physical exam at bedside, patient has  persistent right upper extremity weakness, confusion, bradyphrenia, and tongue is deviated to the right.  Per spouse, no metal implants in the body.  ROS: Constitutional: no weight change, no fever ENT/Mouth: no sore throat, no rhinorrhea Eyes: no eye pain, no vision changes Cardiovascular: no chest pain, no dyspnea,  no edema, no palpitations Respiratory: no cough, no sputum, no wheezing Gastrointestinal: no nausea, no vomiting, no diarrhea, no constipation Genitourinary: no urinary incontinence, no dysuria, no hematuria Musculoskeletal: no arthralgias, no myalgias Skin: no skin lesions, no pruritus, Neuro: + weakness, no loss of consciousness, no syncope Psych: no anxiety, no depression, no decrease appetite Heme/Lymph: no bruising, no bleeding  ED Course: Discussed with ED provider, patient requiring hospitalization due to stroke.  Vitals in the ED revealed temperature of 98.1, respiration rate of 20, heart rate of 70, blood pressure 148/82, satting at 97% on room air.  Labs in the emergency department was remarkable for serum creatinine of 1.02, BUN 19, serum sodium 137, potassium 3.7, nonfasting glucose of 211, WBC of 3.7,  Assessment/Plan  Principal Problem:   Stroke-like episode Active Problems:   HTN (hypertension)   Type 2 diabetes mellitus with hyperlipidemia (HCC)   PAD (peripheral artery disease) (HCC)   History of BPH   History of alcohol abuse   Stroke [facility])   Stroke-Per neurology suspect watershed stroke - CT head without contrast read as: Concern for acute infarcts in the mid left frontal, mid left occipital, superior posterior right centrum semiovale.  Distribution of  potential acute infarct suggest underlying embolic phenomenon.  No mass or hemorrhage evident.  Multiple foci of arterial vascular calcification noted. - Neurology has been consulted and we appreciate further recommendations - Complete echo with bubble study - MRI of the brain ordered - CTA of the  head and neck with or without contrast ordered per ED provider - Fasting lipid and A1c ordered - Permissive hypertension per neurology recommendations - Frequent neuro vascular checks - PT, OT -Patient passed nursing swallow study, heart healthy/carb modified diet initiated - Fall precaution and aspiration precaution - UDS  Hypertension-on amlodipine 10 mg daily -Holding for permissive hypertension pending neurology evaluation -Given new diagnosis of non-insulin-dependent diabetes mellitus, patient would benefit from a class of ACE/ARB for hypertension management -Follow-up with outpatient PCP  Cocaine abuse-extensive counseling given especially cocaine can precipitate stroke -Patient nodded however I do not feel like patient is ready to quit cocaine at this time  Alcohol abuse-patient quit 2 weeks ago, however CIWA protocol has been initiated per nursing request  Marijuana use-counseling given to stop  Non-insulin-dependent diabetes mellitus-checking A1c -Patient not taking any oral antihyperglycemics at this time  PAD-Plavix 75 mg daily resumed Hypercholesterolemia-resumed atorvastatin 80 mg daily  Chart reviewed.   DVT prophylaxis: SCDs Code Status: Full code Diet: Heart healthy/carb modified Family Communication: Updated spouse, Mrs. Kelvin Sennett at bedside Disposition Plan: Pending clinical course Consults called: Neurology Admission status: Observation MedSurg with telemetry  Past Medical History:  Diagnosis Date  . High cholesterol   . Hypertension   . PVD (peripheral vascular disease) (HCC)    Social History:  reports that he has quit smoking. He has never used smokeless tobacco. He reports previous alcohol use. He reports previous drug use.  No Known Allergies No family history on file. Family history: Family history reviewed and not pertinent  Prior to Admission medications   Not on File   Physical Exam: Vitals:   06/18/20 1214 06/18/20 1215  BP: (!)  148/86   Pulse: 70   Resp: 20   Temp: 98.1 F (36.7 C)   TempSrc: Oral   SpO2: 97%   Weight:  84.8 kg  Height:  5\' 8"  (1.727 m)   Constitutional: appears age-appropriate, NAD, calm, comfortable Eyes: PERRL, lids and conjunctivae normal ENMT: Mucous membranes are moist. Posterior pharynx clear of any exudate or lesions. Age-appropriate dentition. Hearing appropriate mood.  Tongue is deviated to the right. Neck: normal, supple, no masses, no thyromegaly Respiratory: clear to auscultation bilaterally, no wheezing, no crackles. Normal respiratory effort. No accessory muscle use.  Cardiovascular: Regular rate and rhythm, no murmurs / rubs / gallops. No extremity edema. 2+ pedal pulses. No carotid bruits.  Abdomen: no tenderness, no masses palpated, no hepatosplenomegaly. Bowel sounds positive.  Musculoskeletal: no clubbing / cyanosis. No joint deformity upper and lower extremities. Good ROM, no contractures, no atrophy. Normal muscle tone.  Skin: no rashes, lesions, ulcers. No induration Neurologic: Sensation intact. Strength 5/5 in all bilateral lower extremity and left upper extremity.  Strength is 4 out of 5 in the right upper extremity. Psychiatric: Normal judgment and insight. Alert and oriented x 3. Normal mood.   EKG: independently reviewed, showing sinus rhythm with rate of 80, QTc 431  Chest x-ray on Admission: Ordered and pending  CT HEAD WO CONTRAST  Result Date: 06/18/2020 CLINICAL DATA:  Altered mental status EXAM: CT HEAD WITHOUT CONTRAST TECHNIQUE: Contiguous axial images were obtained from the base of the skull through the vertex without intravenous contrast. COMPARISON:  None. FINDINGS: Brain: Ventricles and sulci are normal in size and configuration. There is no intracranial mass, hemorrhage, extra-axial fluid collection, midline shift. There is decreased attenuation in the mid left frontal lobe consistent with a recent and potentially acute infarct. A similar appearing area  of decreased attenuation is noted in the mid left occipital lobe, concerning for recent and potentially acute infarct. There is a focus of decreased attenuation in the posterior right centrum semiovale which may represent a recent and possibly acute white matter infarct in this area. Vascular: No hyperdense vessel. There is calcification in each distal vertebral artery and carotid siphon region. Skull: Bony calvarium appears intact. Sinuses/Orbits: Mucosal thickening noted in several ethmoid air cells and in the anterior sphenoid sinus regions. Visualized orbits appear symmetric bilaterally. Other: Mastoid air cells are clear. IMPRESSION: Concern for acute infarcts in the mid left frontal, mid left occipital, and superior posterior right centrum semiovale. This distribution of potential acute infarcts suggests underlying embolic phenomenon. No mass or hemorrhage evident. Multiple foci of arterial vascular calcification noted. There is mucosal thickening in several paranasal sinus regions. Electronically Signed   By: Bretta Bang III M.D.   On: 06/18/2020 12:56   Labs on Admission: I have personally reviewed following labs  CBC: Recent Labs  Lab 06/18/20 1227  WBC 3.7*  NEUTROABS 2.6  HGB 14.9  HCT 46.1  MCV 89.2  PLT 194   Basic Metabolic Panel: Recent Labs  Lab 06/18/20 1227  NA 137  K 3.7  CL 104  CO2 24  GLUCOSE 211*  BUN 19  CREATININE 1.02  CALCIUM 9.3   GFR: Estimated Creatinine Clearance: 72.5 mL/min (by C-G formula based on SCr of 1.02 mg/dL).  Liver Function Tests: Recent Labs  Lab 06/18/20 1227  AST 24  ALT 20  ALKPHOS 52  BILITOT 0.8  PROT 7.1  ALBUMIN 4.5   Coagulation Profile: Recent Labs  Lab 06/18/20 1227  INR 1.1   CBG: Recent Labs  Lab 06/18/20 1227  GLUCAP 189*   Obelia Bonello N Lyncoln Maskell D.O. [facility]  If 7PM-7AM, please contact overnight-coverage provider If 7AM-7PM, please contact day coverage provider [email or web address]  06/18/2020, 1:37 PM

## 2020-06-18 NOTE — Plan of Care (Signed)
  Problem: Health Behavior/Discharge Planning: Goal: Ability to manage health-related needs will improve Outcome: Progressing   Problem: Clinical Measurements: Goal: Ability to maintain clinical measurements within normal limits will improve Outcome: Progressing Goal: Will remain free from infection Outcome: Progressing Goal: Diagnostic test results will improve Outcome: Progressing Goal: Respiratory complications will improve Outcome: Progressing Goal: Cardiovascular complication will be avoided Outcome: Progressing   Problem: Activity: Goal: Risk for activity intolerance will decrease Outcome: Progressing   Problem: Coping: Goal: Level of anxiety will decrease Outcome: Progressing   Problem: Safety: Goal: Ability to remain free from injury will improve Outcome: Progressing   Problem: Education: Goal: Knowledge of disease or condition will improve Outcome: Progressing Goal: Knowledge of secondary prevention will improve Outcome: Progressing Goal: Knowledge of patient specific risk factors addressed and post discharge goals established will improve Outcome: Progressing Goal: Individualized Educational Video(s) Outcome: Progressing   Problem: Self-Care: Goal: Ability to participate in self-care as condition permits will improve Outcome: Progressing

## 2020-06-18 NOTE — ED Notes (Signed)
ED Provider at bedside. 

## 2020-06-19 ENCOUNTER — Observation Stay: Admit: 2020-06-19 | Payer: Medicare Other

## 2020-06-19 ENCOUNTER — Observation Stay (HOSPITAL_COMMUNITY)
Admit: 2020-06-19 | Discharge: 2020-06-19 | Disposition: A | Discharge status: Home / Self Care | Payer: Medicare Other | Attending: Internal Medicine | Admitting: Internal Medicine

## 2020-06-19 DIAGNOSIS — Z8673 Personal history of transient ischemic attack (TIA), and cerebral infarction without residual deficits: Secondary | ICD-10-CM | POA: Diagnosis not present

## 2020-06-19 DIAGNOSIS — H9190 Unspecified hearing loss, unspecified ear: Secondary | ICD-10-CM | POA: Diagnosis present

## 2020-06-19 DIAGNOSIS — F1011 Alcohol abuse, in remission: Secondary | ICD-10-CM | POA: Diagnosis not present

## 2020-06-19 DIAGNOSIS — E1151 Type 2 diabetes mellitus with diabetic peripheral angiopathy without gangrene: Secondary | ICD-10-CM | POA: Diagnosis present

## 2020-06-19 DIAGNOSIS — I63522 Cerebral infarction due to unspecified occlusion or stenosis of left anterior cerebral artery: Secondary | ICD-10-CM

## 2020-06-19 DIAGNOSIS — I63412 Cerebral infarction due to embolism of left middle cerebral artery: Secondary | ICD-10-CM | POA: Diagnosis present

## 2020-06-19 DIAGNOSIS — F1410 Cocaine abuse, uncomplicated: Secondary | ICD-10-CM | POA: Diagnosis present

## 2020-06-19 DIAGNOSIS — R471 Dysarthria and anarthria: Secondary | ICD-10-CM | POA: Diagnosis present

## 2020-06-19 DIAGNOSIS — I700 Atherosclerosis of aorta: Secondary | ICD-10-CM | POA: Diagnosis present

## 2020-06-19 DIAGNOSIS — G9341 Metabolic encephalopathy: Secondary | ICD-10-CM | POA: Diagnosis present

## 2020-06-19 DIAGNOSIS — R29706 NIHSS score 6: Secondary | ICD-10-CM | POA: Diagnosis present

## 2020-06-19 DIAGNOSIS — I639 Cerebral infarction, unspecified: Secondary | ICD-10-CM | POA: Diagnosis not present

## 2020-06-19 DIAGNOSIS — I672 Cerebral atherosclerosis: Secondary | ICD-10-CM | POA: Diagnosis present

## 2020-06-19 DIAGNOSIS — Z20822 Contact with and (suspected) exposure to covid-19: Secondary | ICD-10-CM | POA: Diagnosis present

## 2020-06-19 DIAGNOSIS — E1169 Type 2 diabetes mellitus with other specified complication: Secondary | ICD-10-CM | POA: Diagnosis present

## 2020-06-19 DIAGNOSIS — I6389 Other cerebral infarction: Secondary | ICD-10-CM | POA: Diagnosis not present

## 2020-06-19 DIAGNOSIS — Z9114 Patient's other noncompliance with medication regimen: Secondary | ICD-10-CM | POA: Diagnosis not present

## 2020-06-19 DIAGNOSIS — F1210 Cannabis abuse, uncomplicated: Secondary | ICD-10-CM | POA: Diagnosis present

## 2020-06-19 DIAGNOSIS — Z7902 Long term (current) use of antithrombotics/antiplatelets: Secondary | ICD-10-CM | POA: Diagnosis not present

## 2020-06-19 DIAGNOSIS — I10 Essential (primary) hypertension: Secondary | ICD-10-CM | POA: Diagnosis present

## 2020-06-19 DIAGNOSIS — G8321 Monoplegia of upper limb affecting right dominant side: Secondary | ICD-10-CM | POA: Diagnosis present

## 2020-06-19 DIAGNOSIS — F1010 Alcohol abuse, uncomplicated: Secondary | ICD-10-CM | POA: Diagnosis present

## 2020-06-19 DIAGNOSIS — Z79899 Other long term (current) drug therapy: Secondary | ICD-10-CM | POA: Diagnosis not present

## 2020-06-19 DIAGNOSIS — E785 Hyperlipidemia, unspecified: Secondary | ICD-10-CM | POA: Diagnosis present

## 2020-06-19 DIAGNOSIS — R739 Hyperglycemia, unspecified: Secondary | ICD-10-CM | POA: Diagnosis present

## 2020-06-19 DIAGNOSIS — I669 Occlusion and stenosis of unspecified cerebral artery: Secondary | ICD-10-CM

## 2020-06-19 DIAGNOSIS — Z7151 Drug abuse counseling and surveillance of drug abuser: Secondary | ICD-10-CM | POA: Diagnosis not present

## 2020-06-19 DIAGNOSIS — N400 Enlarged prostate without lower urinary tract symptoms: Secondary | ICD-10-CM | POA: Diagnosis present

## 2020-06-19 DIAGNOSIS — Z87891 Personal history of nicotine dependence: Secondary | ICD-10-CM | POA: Diagnosis not present

## 2020-06-19 DIAGNOSIS — E7800 Pure hypercholesterolemia, unspecified: Secondary | ICD-10-CM | POA: Diagnosis present

## 2020-06-19 DIAGNOSIS — I739 Peripheral vascular disease, unspecified: Secondary | ICD-10-CM | POA: Diagnosis not present

## 2020-06-19 DIAGNOSIS — E876 Hypokalemia: Secondary | ICD-10-CM | POA: Diagnosis present

## 2020-06-19 LAB — CBC
HCT: 45.4 % (ref 39.0–52.0)
Hemoglobin: 15.3 g/dL (ref 13.0–17.0)
MCH: 29.1 pg (ref 26.0–34.0)
MCHC: 33.7 g/dL (ref 30.0–36.0)
MCV: 86.3 fL (ref 80.0–100.0)
Platelets: 208 10*3/uL (ref 150–400)
RBC: 5.26 MIL/uL (ref 4.22–5.81)
RDW: 12.3 % (ref 11.5–15.5)
WBC: 4.4 10*3/uL (ref 4.0–10.5)
nRBC: 0 % (ref 0.0–0.2)

## 2020-06-19 LAB — GLUCOSE, CAPILLARY
Glucose-Capillary: 111 mg/dL — ABNORMAL HIGH (ref 70–99)
Glucose-Capillary: 112 mg/dL — ABNORMAL HIGH (ref 70–99)
Glucose-Capillary: 114 mg/dL — ABNORMAL HIGH (ref 70–99)
Glucose-Capillary: 124 mg/dL — ABNORMAL HIGH (ref 70–99)
Glucose-Capillary: 125 mg/dL — ABNORMAL HIGH (ref 70–99)
Glucose-Capillary: 125 mg/dL — ABNORMAL HIGH (ref 70–99)
Glucose-Capillary: 140 mg/dL — ABNORMAL HIGH (ref 70–99)

## 2020-06-19 LAB — BASIC METABOLIC PANEL
Anion gap: 9 (ref 5–15)
BUN: 14 mg/dL (ref 8–23)
CO2: 23 mmol/L (ref 22–32)
Calcium: 9.3 mg/dL (ref 8.9–10.3)
Chloride: 107 mmol/L (ref 98–111)
Creatinine, Ser: 0.92 mg/dL (ref 0.61–1.24)
GFR, Estimated: >60 mL/min (ref >60)
Glucose, Bld: 139 mg/dL — ABNORMAL HIGH (ref 70–99)
Potassium: 3.4 mmol/L — ABNORMAL LOW (ref 3.5–5.1)
Sodium: 139 mmol/L (ref 135–145)

## 2020-06-19 LAB — URINE DRUG SCREEN, QUALITATIVE (ARMC ONLY)
Amphetamines, Ur Screen: NOT DETECTED
Barbiturates, Ur Screen: NOT DETECTED
Benzodiazepine, Ur Scrn: NOT DETECTED
Cannabinoid 50 Ng, Ur ~~LOC~~: POSITIVE — AB
Cocaine Metabolite,Ur ~~LOC~~: POSITIVE — AB
MDMA (Ecstasy)Ur Screen: NOT DETECTED
Methadone Scn, Ur: NOT DETECTED
Opiate, Ur Screen: NOT DETECTED
Phencyclidine (PCP) Ur S: NOT DETECTED
Tricyclic, Ur Screen: NOT DETECTED

## 2020-06-19 LAB — ECHOCARDIOGRAM COMPLETE BUBBLE STUDY
AR max vel: 2.28 cm2
AV Area VTI: 2.43 cm2
AV Area mean vel: 2.25 cm2
AV Mean grad: 4 mmHg
AV Peak grad: 6.9 mmHg
Ao pk vel: 1.31 m/s
Area-P 1/2: 2.63 cm2
S' Lateral: 2.81 cm

## 2020-06-19 LAB — LIPID PANEL
Cholesterol: 158 mg/dL (ref 0–200)
HDL: 31 mg/dL — ABNORMAL LOW (ref >40)
LDL Cholesterol: 112 mg/dL — ABNORMAL HIGH (ref 0–99)
Total CHOL/HDL Ratio: 5.1 RATIO
Triglycerides: 77 mg/dL (ref <150)
VLDL: 15 mg/dL (ref 0–40)

## 2020-06-19 LAB — HIV ANTIBODY (ROUTINE TESTING W REFLEX): HIV Screen 4th Generation wRfx: NONREACTIVE

## 2020-06-19 MED ORDER — POTASSIUM CHLORIDE CRYS ER 20 MEQ PO TBCR
20.0000 meq | EXTENDED_RELEASE_TABLET | Freq: Once | ORAL | Status: AC
  Administered 2020-06-19: 20 meq via ORAL
  Filled 2020-06-19: qty 1

## 2020-06-19 NOTE — Progress Notes (Addendum)
Neurology Progress Note Micheal Owens MR# [id number] 06/19/2020  S: no overnight events; no new complaints. Stroke workup largely complete. Wife is at bedside and helped to provide history.  O: Current vital signs: BP (!) 156/119 (BP Location: Right Arm)   Pulse 64   Temp 98.5 F (36.9 C)   Resp 17   Ht 5\' 8"  (1.727 m)   Wt 84.8 kg   SpO2 100%   BMI 28.43 kg/m  Vital signs in last 24 hours: Temp:  [97.3 F (36.3 C)-98.5 F (36.9 C)] 98.5 F (36.9 C) (03/14 0755) Pulse Rate:  [58-70] 64 (03/14 0755) Resp:  [17-27] 17 (03/14 0755) BP: (148-169)/(76-119) 156/119 (03/14 0755) SpO2:  [96 %-100 %] 100 % (03/14 0755) Weight:  [84.8 kg] 84.8 kg (03/13 1215) GENERAL: Awake, alert in NAD HEENT: Normocephalic and atraumatic, moist mm, no LAD, no thyromegaly LUNGS: symmetric excursions bilaterally with no audible wheezes. CV: RR, equal pulses bilaterally. ABDOMEN: Soft, nontender, nondistended with normoactive BS Ext: warm, well perfused, intact peripheral pulses. NEURO:  Mental Status: AA&Ox3  Language: speech is fluent. Intact naming, repetition, and comprehension. PERR. EOMI, visual fields full, right nasolabial fold flattening, facial sensation intact. Significant hearing loss not wearing hearing aids. No evidence of tongue atrophy or fibrillations, tongue/uvula/soft palate midline elevates symmetrically  Normal sternocleidomastoid and trapezius muscle strength.  Motor: 5/5 strength in all extremities Mild RUE pronator drift. Babinski (+)R Tone: Tone and bulk is normal. Sensation: Intact to temperature bilaterally. Coordination: FTN intact bilaterally, no ataxia in BLE. Gait - Deferred  Medications  Current Facility-Administered Medications:  .   stroke: mapping our early stages of recovery book, , Does not apply, Once, Cox, Amy N, DO .  acetaminophen (TYLENOL) tablet 325 mg, 325 mg, Oral, Q4H PRN **OR** acetaminophen (TYLENOL) 160 MG/5ML solution 650 mg, 650 mg, Per Tube,  Q4H PRN **OR** acetaminophen (TYLENOL) suppository 650 mg, 650 mg, Rectal, Q4H PRN, Cox, Amy N, DO .  aspirin chewable tablet 81 mg, 81 mg, Oral, Daily, 81 mg at 06/19/20 0924 **OR** aspirin suppository 300 mg, 300 mg, Rectal, Daily, Cox, Amy N, DO .  atorvastatin (LIPITOR) tablet 80 mg, 80 mg, Oral, Daily, Cox, Amy N, DO, 80 mg at 06/19/20 0924 .  folic acid (FOLVITE) tablet 1 mg, 1 mg, Oral, Daily, Cox, Amy N, DO, 1 mg at 06/19/20 0923 .  insulin aspart (novoLOG) injection 0-15 Units, 0-15 Units, Subcutaneous, Q4H, Cox, Amy N, DO, 2 Units at 06/19/20 0806 .  labetalol (NORMODYNE) injection 5 mg, 5 mg, Intravenous, Q2H PRN, Cox, Amy N, DO .  LORazepam (ATIVAN) tablet 1-4 mg, 1-4 mg, Oral, Q1H PRN **OR** LORazepam (ATIVAN) injection 1-4 mg, 1-4 mg, Intravenous, Q1H PRN, Cox, Amy N, DO .  multivitamin with minerals tablet 1 tablet, 1 tablet, Oral, Daily, Cox, Amy N, DO, 1 tablet at 06/19/20 0923 .  senna-docusate (Senokot-S) tablet 1 tablet, 1 tablet, Oral, BID, Cox, Amy N, DO, 1 tablet at 06/19/20 0924 .  thiamine tablet 100 mg, 100 mg, Oral, Daily, 100 mg at 06/19/20 0924 **OR** thiamine (B-1) injection 100 mg, 100 mg, Intravenous, Daily, Cox, Amy N, DO Labs   Ref. Range 06/19/2020 06:09  HDL Cholesterol Latest Ref Range: >40 mg/dL 31 (L)  LDL (calc) Latest Ref Range: 0 - 99 mg/dL 06/21/2020 (H)  Triglycerides Latest Ref Range: <150 mg/dL 77    Ref. Range 06/18/2020 13:12  Hemoglobin A1C Latest Ref Range: 4.8 - 5.6 % 7.5 (H)    Ref. Range 06/18/2020 13:27  Cocaine Metabolite,Ur [place] Latest Ref Range: NONE DETECTED  POSITIVE (A)    Ref. Range 06/18/2020 13:27  Cannabinoid 50 Ng, Ur [place] Latest Ref Range: NONE DETECTED  POSITIVE (A)    Imaging I have reviewed images in epic and the results pertinent to this consultation are: CT Head showed hypodensities concerning for acute infarcts in the mid left frontal, mid left occipital, and superior posterior right centrum semiovale. This distribution of  potential acute infarcts suggests underlying embolic phenomenon.   CTA head and neck showed no large vessel occlusion. Intracranial atherosclerosis including severe left M1 and mild-to-moderate bilateral ICA stenoses. 55% distal left common carotid artery stenosis. Moderate right vertebral artery origin stenosis. Aortic Atherosclerosis .  MRI Brain showed patchy acute cortical and subcortical infarcts throughout the left cerebral hemisphere in the frontal, parietal, occipital, and temporal lobes involving the MCA territory and border zones. T2 hyperintensities elsewhere in the cerebral white matter bilaterally and in the pons are nonspecific but compatible with mild chronic small vessel ischemic disease. There are chronic lacunar infarcts in the basal ganglia, thalami, and pons.   Assessment:  70 year old right handed man with above past medical history including depressiveness abuse, hypertension, hyperlipidemia, DM 2, alcohol, cannabis, cocaine abuse with 3 days of increasing confusion, difficulty forming words and using his right hand. Arrived outside the window for tPA and found to have acute embolic strokes. He feels like he is back to baseline.   His wife added though he does use cocaine and cannabis, he does not use injectable narcotics and CTA did not suggest mycotic aneurysms.  Imaging showed acute embolic strokes likely artery to artery secondary to the tachycardic, blood pressure and vasospastic effects of cocaine in setting of preexisting vascular risk factors.  Impression:  Acute ischemic stroke - Multifocal left MCA atheroembolic.  Encephalopathy - Resolved.  Polysubstance abuse (cocaine, cannabis).  Intracranial atherosclerosis.  Hypertension.  Hyperlipidemia.  DM 2 (04/22/2019).  PAD.  Carotid artery atherosclerosis.  Aortic Atherosclerosis (ICD10-I70.0).  Alcohol abuse-recently quit-no reported seizure activity.  Significant hearing loss.  Occasional  medication non-adherence (noted OSH chart review).   Recommendations:  2D echo - Results Pending  Continue aspirin 81 only.    Chart review showed he was recently switched from ASA to clopidogrel 75mg  daily.  If the echocardiogram does not suggest infective endocarditis or arrhythmia and if telemetry does not show arrhythmia, consider dual antiplatelets for 3 weeks then continuing only clopidogrel 75mg  daily. or anticoagulants.  PT/OT/speech therapy  Continue thiamine PO for now.  Consider Holter monitor on discharge based on chronic bilateral thalamic and brainstem strokes.   Electronically signed by:  , MD Page: 06/19/2020, 9:45 AM

## 2020-06-19 NOTE — Progress Notes (Signed)
Speech Language Pathology Evaluation Patient Details Name: Micheal Owens MRN: [id number] DOB: September 21, 1950 Today's Date: 06/19/2020 Time: 8469-6295 SLP Time Calculation (min) (ACUTE ONLY): 44 min  Problem List:  Patient Active Problem List   Diagnosis Date Noted   HTN (hypertension) 06/18/2020   Type 2 diabetes mellitus with hyperlipidemia (HCC) 06/18/2020   PAD (peripheral artery disease) (HCC) 06/18/2020   History of BPH 06/18/2020   History of alcohol abuse 06/18/2020   Stroke-like episode 06/18/2020   Stroke (HCC) 06/18/2020   Past Medical History:  Past Medical History:  Diagnosis Date   High cholesterol    Hypertension    PVD (peripheral vascular disease) (HCC)    Past Surgical History:  Past Surgical History:  Procedure Laterality Date   FEMORAL ARTERY STENT     HPI:  Pt is a 70 y.o. male presenting to hospital 3/13 with weakness R arm, confusion, and difficulty with speech (expressive and receptive aphasia).  CT of brain showing "hypodensities indicating potentially subacute infarct in the mid left frontal mid left occipital and superior posterior right centrum semiovale".  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   PMH includes htn, HDL, PVD on Plavix, h/o alcohol abuse, polysubstance abuse, DM type 2.   Assessment / Plan / Recommendation Clinical Impression   Patient presents with mild aphasia and moderate cognitive communication deficits. Appears to have both receptive and expressive language deficits, however hearing loss is a contributing factor in pt's ability to follow commands and respond appropriately in simple conversation. Ability to respond to simple Y/N questions and follow one-step commands is well-preserved when appropriate accommodations made for hearing impairment. For multistep or complex commands, or mod complex conversation, auditory comprehension is impaired. Object naming 18/20; dysnomia occurs occasionally in simple conversation.  Pt scored 16/30 on the Mini Mental State examination (above 26 is WNL), with deficits noted in attention, recall, problem solving and language. Functional recall appears impaired with pt giving inaccurate details about family. Daughter and wife report pt had similar deficits at baseline but these appear exacerbated since CVA. Discussed follow-up options for ST, including home health; they decline these at this time. Daughter works in Teacher, music; she and pt's wife report they will be able to provide adequate support, as pt is not likely to follow up with therapies. Educated on strategies to increase comprehension and strategies for communication due to hearing loss, including chunking of information, speaking face-to-face in close proximity. Daughter is hopeful pt can discuss hearing aids and would like him to follow up with audiology. Appears he has had these in the past but does not have them currently. No further skilled ST needs are indicated at this time.    SLP Assessment  SLP Recommendation/Assessment: Patient does not need any further Speech Lanaguage Pathology Services SLP Visit Diagnosis: Aphasia (R47.01);Cognitive communication deficit (R41.841)    Follow Up Recommendations  None (recommend OP follow-up with audiology for hearing loss)   Frequency and Duration Other (Comment) (evaluation only)         SLP Evaluation Cognition  Overall Cognitive Status: Impaired/Different from baseline Arousal/Alertness: Awake/alert Orientation Level: Oriented to person;Oriented to place;Disoriented to situation Attention: Sustained Sustained Attention: Impaired Sustained Attention Impairment: Verbal complex Immediate Memory Recall:  (3/3 words immediate recall, 1/3 with delay) Awareness: Impaired Awareness Impairment: Emergent impairment Problem Solving: Impaired Problem Solving Impairment: Verbal basic Executive Function: Reasoning;Decision Making Reasoning: Impaired (suspect impaired at  baseline) Decision Making: Impaired Decision Making Impairment:  (suspect impaired at baseline) Safety/Judgment: Impaired  Comprehension  Auditory Comprehension Overall Auditory Comprehension: Impaired Yes/No Questions: Impaired Basic Biographical Questions: 76-100% accurate Basic Immediate Environment Questions: 75-100% accurate Complex Questions: 50-74% accurate Commands: Impaired One Step Basic Commands: 75-100% accurate Two Step Basic Commands: 50-74% accurate Multistep Basic Commands: 50-74% accurate Conversation: Simple Other Conversation Comments: needs frequent repetition, clarification Interfering Components: Hearing (per chart review has been fitted for hearing aids but does not have any now) EffectiveTechniques: Extra processing time;Pausing;Repetition;Slowed speech;Stressing words;Increased volume Visual Recognition/Discrimination Discrimination: Within Function Limits Reading Comprehension Reading Status: Unable to assess (comment) (wife reports pt does not read much at baseline)    Expression Expression Primary Mode of Expression: Verbal Verbal Expression Overall Verbal Expression: Impaired Initiation: No impairment Automatic Speech: Name;Social Response Level of Generative/Spontaneous Verbalization: Conversation (able to converse briefly about fishing and subjects of interest) Repetition: Impaired Level of Impairment: Word level Naming: Impairment Confrontation: Impaired (9/10) Other Naming Comments: approximations/perseverations: "glass" for window and for light Verbal Errors: Semantic paraphasias;Perseveration Pragmatics: No impairment Interfering Components: Attention Effective Techniques: Open ended questions Non-Verbal Means of Communication: Not applicable Written Expression Written Expression:  (does not write much at baseline; able to write name and address)   Oral / Motor  Oral Motor/Sensory Function Overall Oral Motor/Sensory Function:  Within functional limits Motor Speech Overall Motor Speech: Appears within functional limits for tasks assessed   GO                   Micheal Baton, MS, CCC-SLP Speech-Language Pathologist  Arlana Lindau 06/19/2020, 1:20 PM

## 2020-06-19 NOTE — Progress Notes (Signed)
PT Cancellation Note  Patient Details Name: Micheal Owens MRN: [id number] DOB: 05-21-1950   Cancelled Treatment:    Reason Eval/Treat Not Completed: Other (comment).  PT consult received.  Chart reviewed.  Pt sitting in recliner upon PT arrival; pt's wife present.  Therapist asked PLOF and home living situation questions.  Before therapist able to continue evaluation, pt's nurse arrived to give pt medications and then lab came to draw blood.  Will re-attempt PT evaluation at a later date/time as able.  Hendricks Limes, PT 06/19/20, 9:37 AM

## 2020-06-19 NOTE — Plan of Care (Signed)
Patient has some mild delay in responding to questions but is progressively improving.  Problem: Health Behavior/Discharge Planning: Goal: Ability to manage health-related needs will improve Outcome: Progressing   Problem: Clinical Measurements: Goal: Ability to maintain clinical measurements within normal limits will improve Outcome: Progressing Goal: Will remain free from infection Outcome: Progressing Goal: Diagnostic test results will improve Outcome: Progressing Goal: Respiratory complications will improve Outcome: Progressing Goal: Cardiovascular complication will be avoided Outcome: Progressing   Problem: Activity: Goal: Risk for activity intolerance will decrease Outcome: Progressing   Problem: Coping: Goal: Level of anxiety will decrease Outcome: Progressing   Problem: Safety: Goal: Ability to remain free from injury will improve Outcome: Progressing   Problem: Education: Goal: Knowledge of disease or condition will improve Outcome: Progressing Goal: Knowledge of secondary prevention will improve Outcome: Progressing Goal: Knowledge of patient specific risk factors addressed and post discharge goals established will improve Outcome: Progressing Goal: Individualized Educational Video(s) Outcome: Progressing   Problem: Self-Care: Goal: Ability to participate in self-care as condition permits will improve Outcome: Progressing

## 2020-06-19 NOTE — Progress Notes (Signed)
PROGRESS NOTE    Micheal BeringJames Owens  GNF:[id number] DOB: 12-11-1950 DOA: 06/18/2020 [facility]   Assessment & Plan:   Principal Problem:   Stroke-like episode Active Problems:   HTN (hypertension)   Type 2 diabetes mellitus with hyperlipidemia (HCC)   PAD (peripheral artery disease) (HCC)   History of BPH   History of alcohol abuse   Stroke [facility]HCC)   CVA (cerebral vascular accident) (HCC)   CVA: concern for acute infarcts in the mid left frontal, mid left occipital, superior posterior right centrum semiovale.  Distribution of potential acute infarct suggest underlying embolic phenomenon.  No mass or hemorrhage evident. MRI brain shows patch acute left cerebral hemispheric infracts in the MCA territory. CTA head/neck show no large vessel occlusion, severe left M1 & mild to mod b/l ICA stenosis, moderate right vertebral artery origin stenosis. Echo ordered. Allow permissive HTN. Neuro following and recs apprec  HTN: holding home dose of amlodipine to allow permissive HTN  Hypokalemia: KCl repleted. Will continue to monitor  DM2: likely poorly controlled. Continue on SSI w/ accuchecks   Cocaine & marijuana abuse: illicit drug abuse cessation counseling   Alcohol abuse: quit 2 weeks ago. Continue on CIWA protocol   PAD: continue to hold plavix. Continue on statin  HLD: continue on statin    DVT prophylaxis: SCDs Code Status: full  Family Communication: discussed pt's care w/ pt's family at bedside and answered their quesitons Disposition Plan:  Likely d/c back home  Status is: Inpatient  Remains inpatient appropriate because:Ongoing diagnostic testing needed not appropriate for outpatient work up and Inpatient level of care appropriate due to severity of illness   Dispo: The patient is from: Home              Anticipated d/c is to: Home              Patient currently is not medically stable to d/c.   Difficult to place patient Yes    Consultants:    neuro   Procedures:    Antimicrobials:   Subjective: Pt c/o fatigue  Objective: Vitals:   06/19/20 0336 06/19/20 0755 06/19/20 1145 06/19/20 1222  BP: (!) 159/95 (!) 156/119 (!) 170/93 (!) 172/92  Pulse: 67 64 66 65  Resp: 19 17  18   Temp: 98 F (36.7 C) 98.5 F (36.9 C)  97.9 F (36.6 C)  TempSrc: Oral     SpO2: 96% 100%  100%  Weight:      Height:        Intake/Output Summary (Last 24 hours) at 06/19/2020 1426 Last data filed at 06/19/2020 1356 Gross per 24 hour  Intake 120 ml  Output --  Net 120 ml   Filed Weights   06/18/20 1215  Weight: 84.8 kg    Examination:  General exam: Appears comfortable. Disheveled. Rotten & missing teeth  Respiratory system: Clear to auscultation. Respiratory effort normal. Cardiovascular system: S1 & S2 +. No rubs, gallops or clicks.  Gastrointestinal system: Abdomen is nondistended, soft and nontender. Normal bowel sounds heard. Central nervous system: Alert and oriented. Moves all 4 extremities  Psychiatry: Judgement and insight appear abnormal. Flat mood and affect    Data Reviewed: I have personally reviewed following labs and imaging studies  CBC: Recent Labs  Lab 06/18/20 1227 06/19/20 0927  WBC 3.7* 4.4  NEUTROABS 2.6  --   HGB 14.9 15.3  HCT 46.1 45.4  MCV 89.2 86.3  PLT 194 208   Basic Metabolic Panel: Recent Labs  Lab 06/18/20 1227 06/18/20 1514 06/19/20 0927  NA 137  --  139  K 3.7  --  3.4*  CL 104  --  107  CO2 24  --  23  GLUCOSE 211*  --  139*  BUN 19  --  14  CREATININE 1.02  --  0.92  CALCIUM 9.3  --  9.3  MG  --  2.0  --   PHOS  --  3.6  --    GFR: Estimated Creatinine Clearance: 80.4 mL/min (by C-G formula based on SCr of 0.92 mg/dL). Liver Function Tests: Recent Labs  Lab 06/18/20 1227  AST 24  ALT 20  ALKPHOS 52  BILITOT 0.8  PROT 7.1  ALBUMIN 4.5   No results for input(s): LIPASE, AMYLASE in the last 168 hours. No results for input(s): AMMONIA in the last 168  hours. Coagulation Profile: Recent Labs  Lab 06/18/20 1227  INR 1.1   Cardiac Enzymes: No results for input(s): CKTOTAL, CKMB, CKMBINDEX, TROPONINI in the last 168 hours. BNP (last 3 results) No results for input(s): PROBNP in the last 8760 hours. HbA1C: Recent Labs    06/18/20 1312  HGBA1C 7.5*   CBG: Recent Labs  Lab 06/18/20 2142 06/19/20 0016 06/19/20 0338 06/19/20 0756 06/19/20 1224  GLUCAP 111* 124* 125* 140* 125*   Lipid Profile: Recent Labs    06/19/20 0609  CHOL 158  HDL 31*  LDLCALC 112*  TRIG 77  CHOLHDL 5.1   Thyroid Function Tests: No results for input(s): TSH, T4TOTAL, FREET4, T3FREE, THYROIDAB in the last 72 hours. Anemia Panel: No results for input(s): VITAMINB12, FOLATE, FERRITIN, TIBC, IRON, RETICCTPCT in the last 72 hours. Sepsis Labs: No results for input(s): PROCALCITON, LATICACIDVEN in the last 168 hours.  Recent Results (from the past 240 hour(s))  Resp Panel by RT-PCR (Flu A&B, Covid) Nasopharyngeal Swab     Status: None   Collection Time: 06/18/20  1:27 PM   Specimen: Nasopharyngeal Swab; Nasopharyngeal(NP) swabs in vial transport medium  Result Value Ref Range Status   SARS Coronavirus 2 by RT PCR NEGATIVE NEGATIVE Final    Comment: (NOTE) SARS-CoV-2 target nucleic acids are NOT DETECTED.  The SARS-CoV-2 RNA is generally detectable in upper respiratory specimens during the acute phase of infection. The lowest concentration of SARS-CoV-2 viral copies this assay can detect is 138 copies/mL. A negative result does not preclude SARS-Cov-2 infection and should not be used as the sole basis for treatment or other patient management decisions. A negative result may occur with  improper specimen collection/handling, submission of specimen other than nasopharyngeal swab, presence of viral mutation(s) within the areas targeted by this assay, and inadequate number of viral copies(<138 copies/mL). A negative result must be combined  with clinical observations, patient history, and epidemiological information. The expected result is Negative.  Fact Sheet for Patients:  BloggerCourse.com  Fact Sheet for Healthcare Providers:  SeriousBroker.it  This test is no t yet approved or cleared by the [place] FDA and  has been authorized for detection and/or diagnosis of SARS-CoV-2 by FDA under an Emergency Use Authorization (EUA). This EUA will remain  in effect (meaning this test can be used) for the duration of the COVID-19 declaration under Section 564(b)(1) of the Act, 21 U.S.C.section 360bbb-3(b)(1), unless the authorization is terminated  or revoked sooner.       Influenza A by PCR NEGATIVE NEGATIVE Final   Influenza B by PCR NEGATIVE NEGATIVE Final    Comment: (NOTE) The Xpert Xpress  SARS-CoV-2/FLU/RSV plus assay is intended as an aid in the diagnosis of influenza from Nasopharyngeal swab specimens and should not be used as a sole basis for treatment. Nasal washings and aspirates are unacceptable for Xpert Xpress SARS-CoV-2/FLU/RSV testing.  Fact Sheet for Patients: BloggerCourse.com  Fact Sheet for Healthcare Providers: SeriousBroker.it  This test is not yet approved or cleared by the [place] FDA and has been authorized for detection and/or diagnosis of SARS-CoV-2 by FDA under an Emergency Use Authorization (EUA). This EUA will remain in effect (meaning this test can be used) for the duration of the COVID-19 declaration under Section 564(b)(1) of the Act, 21 U.S.C. section 360bbb-3(b)(1), unless the authorization is terminated or revoked.  Performed at [facility], [address] [place], [place] [address]          Radiology Studies: CT Angio Head W or Wo Contrast  Result Date: 06/18/2020 CLINICAL DATA:  Right arm weakness, confusion, and word-finding difficulty. EXAM: CT  ANGIOGRAPHY HEAD AND NECK TECHNIQUE: Multidetector CT imaging of the head and neck was performed using the standard protocol during bolus administration of intravenous contrast. Multiplanar CT image reconstructions and MIPs were obtained to evaluate the vascular anatomy. Carotid stenosis measurements (when applicable) are obtained utilizing NASCET criteria, using the distal internal carotid diameter as the denominator. CONTRAST:  75mL OMNIPAQUE IOHEXOL 350 MG/ML SOLN COMPARISON:  None. FINDINGS: CTA NECK FINDINGS Aortic arch: Standard 3 a cell aortic arch with mild atherosclerotic plaque. No significant arch vessel origin stenosis. Right carotid system: Patent with moderate calcified plaque at the carotid bifurcation. No evidence of a significant stenosis or dissection. Tortuous distal cervical ICA. Left carotid system: Patent with extensive calcified plaque at the carotid bifurcation resulting in 55% stenosis of the distal common carotid artery. No significant ICA stenosis. Tortuous proximal ICA. Vertebral arteries: The vertebral arteries are patent and codominant without evidence of a significant stenosis on the left within limitations of artifact through the origin. Calcified plaque results in moderate stenosis of the right vertebral artery origin and mild stenosis of the right V1 segment distal to the origin. Skeleton: Moderate disc degeneration at C5-6. Other neck: No evidence of cervical lymphadenopathy or mass. Upper chest: Clear lung apices. Review of the MIP images confirms the above findings CTA HEAD FINDINGS Anterior circulation: The internal carotid arteries are patent from skull base to carotid termini with prominently calcified plaque resulting in likely mild to moderate cavernous and mild supraclinoid stenosis bilaterally with assessment limited by blooming from the dense calcification. ACAs and MCAs are patent without evidence of a proximal branch occlusion or significant A1 or right M1 stenosis.  There is a severe proximal left M1 stenosis with mild asymmetric diffuse attenuation of left MCA branch vessels compared to the right. The left ACA is dominant. No aneurysm is identified. Posterior circulation: The intracranial vertebral arteries are patent to the basilar with prominent calcified plaque in the right V4 segment resulting in mild stenosis. Mild left V4 segment plaque does not result in significant stenosis. Patent left PICA, right AICA, and bilateral SCA origins are identified. The basilar artery is widely patent. Posterior communicating arteries are diminutive or absent. The PCAs are patent with mild irregular narrowing of the left greater than right P2 segments. No aneurysm is identified. Venous sinuses: As permitted by contrast timing, patent. Anatomic variants: Dominant left ACA. Review of the MIP images confirms the above findings IMPRESSION: 1. No large vessel occlusion. 2. Intracranial atherosclerosis including severe left M1 and mild-to-moderate bilateral ICA  stenoses. 3. 55% distal left common carotid artery stenosis. 4. Moderate right vertebral artery origin stenosis. 5. Aortic Atherosclerosis (ICD10-I70.0). Electronically Signed   By: Sebastian Ache M.D.   On: 06/18/2020 14:40   CT HEAD WO CONTRAST  Result Date: 06/18/2020 CLINICAL DATA:  Altered mental status EXAM: CT HEAD WITHOUT CONTRAST TECHNIQUE: Contiguous axial images were obtained from the base of the skull through the vertex without intravenous contrast. COMPARISON:  None. FINDINGS: Brain: Ventricles and sulci are normal in size and configuration. There is no intracranial mass, hemorrhage, extra-axial fluid collection, midline shift. There is decreased attenuation in the mid left frontal lobe consistent with a recent and potentially acute infarct. A similar appearing area of decreased attenuation is noted in the mid left occipital lobe, concerning for recent and potentially acute infarct. There is a focus of decreased attenuation  in the posterior right centrum semiovale which may represent a recent and possibly acute white matter infarct in this area. Vascular: No hyperdense vessel. There is calcification in each distal vertebral artery and carotid siphon region. Skull: Bony calvarium appears intact. Sinuses/Orbits: Mucosal thickening noted in several ethmoid air cells and in the anterior sphenoid sinus regions. Visualized orbits appear symmetric bilaterally. Other: Mastoid air cells are clear. IMPRESSION: Concern for acute infarcts in the mid left frontal, mid left occipital, and superior posterior right centrum semiovale. This distribution of potential acute infarcts suggests underlying embolic phenomenon. No mass or hemorrhage evident. Multiple foci of arterial vascular calcification noted. There is mucosal thickening in several paranasal sinus regions. Electronically Signed   By: Bretta Bang III M.D.   On: 06/18/2020 12:56   CT Angio Neck W and/or Wo Contrast  Result Date: 06/18/2020 CLINICAL DATA:  Right arm weakness, confusion, and word-finding difficulty. EXAM: CT ANGIOGRAPHY HEAD AND NECK TECHNIQUE: Multidetector CT imaging of the head and neck was performed using the standard protocol during bolus administration of intravenous contrast. Multiplanar CT image reconstructions and MIPs were obtained to evaluate the vascular anatomy. Carotid stenosis measurements (when applicable) are obtained utilizing NASCET criteria, using the distal internal carotid diameter as the denominator. CONTRAST:  68mL OMNIPAQUE IOHEXOL 350 MG/ML SOLN COMPARISON:  None. FINDINGS: CTA NECK FINDINGS Aortic arch: Standard 3 a cell aortic arch with mild atherosclerotic plaque. No significant arch vessel origin stenosis. Right carotid system: Patent with moderate calcified plaque at the carotid bifurcation. No evidence of a significant stenosis or dissection. Tortuous distal cervical ICA. Left carotid system: Patent with extensive calcified plaque at the  carotid bifurcation resulting in 55% stenosis of the distal common carotid artery. No significant ICA stenosis. Tortuous proximal ICA. Vertebral arteries: The vertebral arteries are patent and codominant without evidence of a significant stenosis on the left within limitations of artifact through the origin. Calcified plaque results in moderate stenosis of the right vertebral artery origin and mild stenosis of the right V1 segment distal to the origin. Skeleton: Moderate disc degeneration at C5-6. Other neck: No evidence of cervical lymphadenopathy or mass. Upper chest: Clear lung apices. Review of the MIP images confirms the above findings CTA HEAD FINDINGS Anterior circulation: The internal carotid arteries are patent from skull base to carotid termini with prominently calcified plaque resulting in likely mild to moderate cavernous and mild supraclinoid stenosis bilaterally with assessment limited by blooming from the dense calcification. ACAs and MCAs are patent without evidence of a proximal branch occlusion or significant A1 or right M1 stenosis. There is a severe proximal left M1 stenosis with mild asymmetric diffuse attenuation  of left MCA branch vessels compared to the right. The left ACA is dominant. No aneurysm is identified. Posterior circulation: The intracranial vertebral arteries are patent to the basilar with prominent calcified plaque in the right V4 segment resulting in mild stenosis. Mild left V4 segment plaque does not result in significant stenosis. Patent left PICA, right AICA, and bilateral SCA origins are identified. The basilar artery is widely patent. Posterior communicating arteries are diminutive or absent. The PCAs are patent with mild irregular narrowing of the left greater than right P2 segments. No aneurysm is identified. Venous sinuses: As permitted by contrast timing, patent. Anatomic variants: Dominant left ACA. Review of the MIP images confirms the above findings IMPRESSION: 1. No  large vessel occlusion. 2. Intracranial atherosclerosis including severe left M1 and mild-to-moderate bilateral ICA stenoses. 3. 55% distal left common carotid artery stenosis. 4. Moderate right vertebral artery origin stenosis. 5. Aortic Atherosclerosis (ICD10-I70.0). Electronically Signed   By: Sebastian Ache M.D.   On: 06/18/2020 14:40   MR BRAIN WO CONTRAST  Result Date: 06/18/2020 CLINICAL DATA:  Confusion and right-sided weakness. EXAM: MRI HEAD WITHOUT CONTRAST TECHNIQUE: Multiplanar, multiecho pulse sequences of the brain and surrounding structures were obtained without intravenous contrast. COMPARISON:  Head CT and CTA 06/18/2020 FINDINGS: Brain: There are patchy acute cortical and subcortical infarcts throughout the left cerebral hemisphere in the frontal, parietal, occipital, and temporal lobes involving the MCA territory and border zones. No intracranial hemorrhage, mass, midline shift, or extra-axial fluid collection is identified. T2 hyperintensities elsewhere in the cerebral white matter bilaterally and in the pons are nonspecific but compatible with mild chronic small vessel ischemic disease. There are chronic lacunar infarcts in the basal ganglia, thalami, and pons. The ventricles and sulci are within normal limits for age. Vascular: Major intracranial vascular flow voids are preserved. Skull and upper cervical spine: Unremarkable bone marrow signal. Sinuses/Orbits: Unremarkable orbits. Small left maxillary sinus mucous retention cyst. Clear mastoid air cells. Other: None. IMPRESSION: 1. Patchy acute left cerebral hemispheric infarcts in the MCA territory and border zones. 2. Chronic small vessel ischemic disease with multiple chronic lacunar infarcts. Electronically Signed   By: Sebastian Ache M.D.   On: 06/18/2020 17:41        Scheduled Meds: .  stroke: mapping our early stages of recovery book   Does not apply Once  . aspirin  81 mg Oral Daily   Or  . aspirin  300 mg Rectal Daily  .  atorvastatin  80 mg Oral Daily  . folic acid  1 mg Oral Daily  . insulin aspart  0-15 Units Subcutaneous Q4H  . multivitamin with minerals  1 tablet Oral Daily  . senna-docusate  1 tablet Oral BID  . thiamine  100 mg Oral Daily   Or  . thiamine  100 mg Intravenous Daily   Continuous Infusions:   LOS: 0 days    Time spent: 33 mins    Charise Killian, MD [facility] Pager [phone]  If 7PM-7AM, please contact night-coverage 06/19/2020, 2:26 PM

## 2020-06-19 NOTE — Progress Notes (Signed)
*  PRELIMINARY RESULTS* Echocardiogram 2D Echocardiogram has been performed.  Cristela Blue 06/19/2020, 1:51 PM

## 2020-06-19 NOTE — Evaluation (Signed)
Physical Therapy Evaluation Patient Details Name: Micheal Owens MRN: [id number] DOB: 05/09/1950 Today's Date: 06/19/2020   History of Present Illness  Pt is a 70 y.o. male presenting to hospital 3/13 with weakness R arm, confusion, and difficulty with speech (expressive and receptive aphasia).  CT of brain showing "hypodensities indicating potentially subacute infarct in the mid left frontal mid left occipital and superior posterior right centrum semiovale".  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   PMH includes htn, HDL, PVD on Plavix, h/o alcohol abuse, polysubstance abuse, DM type 2.  Clinical Impression  Prior to hospital admission, pt was independent with ambulation; lives with his wife in 1 level home with 1 STE B railings; very active.  Currently pt is SBA with transfers and CGA ambulating 200 feet (no AD).  Mild decreased R heel-strike noted with ambulation.  Pt with 1 loss of balance (sidestepping to L) when walking and looking up when cued but pt able to self correct safely.  Decreased R great toe proprioception noted but pt's expressive/receptive aphasia may complicate results.  Pt's BP 146/99 (HR 65 bpm) at rest beginning of session and BP 175/99 (HR 66 bpm) post ambulation; within a few minutes sitting rest break pt's BP 170/93 resting in recliner (nurse notified of pt's elevated BP).  Pt would benefit from skilled PT to address noted impairments and functional limitations during hospital stay (see below for any additional details).  Upon hospital discharge, no further PT needs anticipated.    Follow Up Recommendations No PT follow up    Equipment Recommendations  None recommended by PT    Recommendations for Other Services OT consult     Precautions / Restrictions Precautions Precautions: Fall Precaution Comments: Aspiration Restrictions Weight Bearing Restrictions: No      Mobility  Bed Mobility               General bed mobility comments:  Deferred (pt sitting in recliner upon PT arrival)    Transfers Overall transfer level: Needs assistance Equipment used: None Transfers: Sit to/from Stand Sit to Stand: Supervision         General transfer comment: steady safe transfers noted  Ambulation/Gait Ambulation/Gait assistance: Min guard Gait Distance (Feet): 200 Feet Assistive device: None Gait Pattern/deviations: Step-through pattern Gait velocity: mildly decreased   General Gait Details: mild decreased R heelstrike  Stairs            Wheelchair Mobility    Modified Rankin (Stroke Patients Only)       Balance Overall balance assessment: Needs assistance Sitting-balance support: No upper extremity supported;Feet supported Sitting balance-Leahy Scale: Normal Sitting balance - Comments: steady sitting reaching outside BOS   Standing balance support: No upper extremity supported;During functional activity Standing balance-Leahy Scale: Good Standing balance comment: Pt steady with ambulation and head turns R/L/down, increasing/decreasing speed, and turning and stopping.  Pt with mild loss of balance (sidestepping to L to correct balance) when looking up while walking but able to safely self correct.                             Pertinent Vitals/Pain Pain Assessment: No/denies pain  HR and O2 on room air stable and WFL throughout treatment session.    Home Living Family/patient expects to be discharged to:: Private residence Living Arrangements: Spouse/significant other Available Help at Discharge: Family Type of Home: House Home Access: Stairs to enter Entrance Stairs-Rails: Right;Left;Can reach both Secretary/administrator  of Steps: 1 Home Layout: One level Home Equipment: Cane - single point;Walker - 4 wheels      Prior Function Level of Independence: Independent               Hand Dominance        Extremity/Trunk Assessment   Upper Extremity Assessment Upper Extremity  Assessment: Defer to OT evaluation    Lower Extremity Assessment Lower Extremity Assessment: Difficult to assess due to impaired cognition;RLE deficits/detail;LLE deficits/detail (intact B LE tone, B LE heel to shin coordination, L LE proprioception, and B LE light touch) RLE Deficits / Details: 4+/5 hip flexion, knee flexion/extension, and DF/PF RLE Sensation: decreased proprioception (decreased proprioception R great toe (vs expressive/receptive aphasia)) LLE Deficits / Details: 4+/5 hip flexion, knee flexion/extension, and DF/PF    Cervical / Trunk Assessment Cervical / Trunk Assessment: Normal  Communication   Communication: Receptive difficulties;Expressive difficulties  Cognition Arousal/Alertness: Awake/alert Behavior During Therapy: WFL for tasks assessed/performed Overall Cognitive Status: Impaired/Different from baseline Area of Impairment: Orientation                 Orientation Level: Time;Situation             General Comments: Pt reporting it was the 17th of March 2002.      General Comments   Nursing cleared pt for participation in physical therapy.  Pt agreeable to PT session.  Pt's wife and daughter present during session.    Exercises     Assessment/Plan    PT Assessment Patient needs continued PT services  PT Problem List Decreased strength;Decreased balance;Decreased mobility;Decreased knowledge of precautions       PT Treatment Interventions DME instruction;Gait training;Stair training;Functional mobility training;Therapeutic activities;Therapeutic exercise;Balance training;Patient/family education    PT Goals (Current goals can be found in the Care Plan section)  Acute Rehab PT Goals Patient Stated Goal: to go home PT Goal Formulation: With patient/family Time For Goal Achievement: 07/03/20 Potential to Achieve Goals: Good    Frequency Min 2X/week   Barriers to discharge        Co-evaluation               AM-PAC PT "6  Clicks" Mobility  Outcome Measure Help needed turning from your back to your side while in a flat bed without using bedrails?: None Help needed moving from lying on your back to sitting on the side of a flat bed without using bedrails?: None Help needed moving to and from a bed to a chair (including a wheelchair)?: A Little Help needed standing up from a chair using your arms (e.g., wheelchair or bedside chair)?: A Little Help needed to walk in hospital room?: A Little Help needed climbing 3-5 steps with a railing? : A Little 6 Click Score: 20    End of Session Equipment Utilized During Treatment: Gait belt Activity Tolerance: Patient tolerated treatment well Patient left: in chair;with call bell/phone within reach;with chair alarm set;with family/visitor present;Other (comment) (SLP in room and reports she will set pt up when she leaves) Nurse Communication: Mobility status;Precautions;Other (comment) (pt's elevated BP post ambulation) PT Visit Diagnosis: Other abnormalities of gait and mobility (R26.89);Hemiplegia and hemiparesis Hemiplegia - Right/Left: Right Hemiplegia - caused by: Cerebral infarction    Time: 9833-8250 PT Time Calculation (min) (ACUTE ONLY): 24 min   Charges:   PT Evaluation $PT Eval Low Complexity: 1 Low PT Treatments $Therapeutic Exercise: 8-22 mins       Hendricks Limes, PT 06/19/20, 12:22 PM

## 2020-06-19 NOTE — Evaluation (Signed)
Occupational Therapy Evaluation Patient Details Name: Micheal Owens MRN: [id number] DOB: [birth year] Today's Date: 06/19/2020    History of Present Illness Pt is a 70 y.o. male presenting to hospital 3/13 with weakness R arm, confusion, and difficulty with speech (expressive and receptive aphasia).  CT of brain showing "hypodensities indicating potentially subacute infarct in the mid left frontal mid left occipital and superior posterior right centrum semiovale".  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   PMH includes htn, HDL, PVD on Plavix, h/o alcohol abuse, polysubstance abuse, DM type 2.   Clinical Impression   Micheal Owens was seen for OT evaluation this date. Prior to hospital admission, pt was Independent for mobility and I/ADLs including driving and yard work. Pt lives with wife in single level home c 1 STE. Pt presents to acute OT demonstrating impaired ADL performance and functional mobility 2/2 decreased safety awareness and functional strength/coordination deficits. Pt currently requires SUPERVISION for toilet t/f and don/doff gown/briefs - sup for standing portion. Pt would benefit from skilled OT to address noted impairments and functional limitations (see below for any additional details) in order to maximize safety and independence while minimizing falls risk and caregiver burden. Upon hospital discharge, anticipate no OT follow up.     Follow Up Recommendations  No OT follow up    Equipment Recommendations  None recommended by OT    Recommendations for Other Services       Precautions / Restrictions Precautions Precautions: Fall Precaution Comments: Aspiration Restrictions Weight Bearing Restrictions: No      Mobility Bed Mobility Overal bed mobility: Independent                 Transfers Overall transfer level: Needs assistance Equipment used: None Transfers: Sit to/from Stand Sit to Stand: Supervision         General  transfer comment: steady safe transfers noted    Balance Overall balance assessment: Needs assistance Sitting-balance support: No upper extremity supported;Feet supported Sitting balance-Leahy Scale: Normal Sitting balance - Comments: steady sitting reaching outside BOS   Standing balance support: No upper extremity supported;During functional activity Standing balance-Leahy Scale: Good Standing balance comment: steady dynamic                           ADL either performed or assessed with clinical judgement   ADL Overall ADL's : Needs assistance/impaired                                       General ADL Comments: SUPERVISION toilet t/f and don/doff gown/briefs - sup for standing portion.                  Pertinent Vitals/Pain Pain Assessment: No/denies pain     Hand Dominance Right   Extremity/Trunk Assessment Upper Extremity Assessment Upper Extremity Assessment: RUE deficits/detail RUE Deficits / Details: 4+/5 grossly, decreased 5 finger opposition   Lower Extremity Assessment Lower Extremity Assessment: Difficult to assess due to impaired cognition RLE Deficits / Details: 4+/5 hip flexion, knee flexion/extension, and DF/PF RLE Sensation: decreased proprioception (decreased proprioception R great toe (vs expressive/receptive aphasia)) LLE Deficits / Details: 4+/5 hip flexion, knee flexion/extension, and DF/PF   Cervical / Trunk Assessment Cervical / Trunk Assessment: Normal   Communication Communication Communication: Receptive difficulties;Expressive difficulties   Cognition Arousal/Alertness: Awake/alert Behavior During Therapy: WFL for tasks assessed/performed Overall  Cognitive Status: Impaired/Different from baseline Area of Impairment: Orientation                 Orientation Level: Time;Situation             General Comments: Pt reporting it was the 17th of March 2002.   General Comments       Exercises  Exercises: Other exercises Other Exercises Other Exercises: Pt educated re: OT role, DME recs, d/c recs, falls prevention, ECS Other Exercises: LBD, UBD, sup>sit, sit<>stand, sitting/standing balance/tolerance   Shoulder Instructions      Home Living Family/patient expects to be discharged to:: Private residence Living Arrangements: Spouse/significant other Available Help at Discharge: Family Type of Home: House Home Access: Stairs to enter Secretary/administrator of Steps: 1 Entrance Stairs-Rails: Right;Left;Can reach both Home Layout: One level     Bathroom Shower/Tub: Tub/shower unit         Home Equipment: Cane - single point;Walker - 4 wheels      Lives With: Family    Prior Functioning/Environment Level of Independence: Independent                 OT Problem List: Decreased activity tolerance;Impaired balance (sitting and/or standing);Decreased cognition;Decreased safety awareness      OT Treatment/Interventions: Self-care/ADL training;Therapeutic exercise;Energy conservation;DME and/or AE instruction;Therapeutic activities;Patient/family education;Balance training    OT Goals(Current goals can be found in the care plan section) Acute Rehab OT Goals Patient Stated Goal: to go home OT Goal Formulation: With patient/family Time For Goal Achievement: 07/03/20 Potential to Achieve Goals: Good ADL Goals Pt Will Perform Grooming: Independently;standing Pt Will Perform Lower Body Dressing: Independently;sit to/from stand Additional ADL Goal #1: Pt will Independently verbalize plan to implement x3 falls prevention strategies.  OT Frequency: Min 1X/week    AM-PAC OT "6 Clicks" Daily Activity     Outcome Measure Help from another person eating meals?: None Help from another person taking care of personal grooming?: None Help from another person toileting, which includes using toliet, bedpan, or urinal?: A Little Help from another person bathing (including  washing, rinsing, drying)?: A Little Help from another person to put on and taking off regular upper body clothing?: None Help from another person to put on and taking off regular lower body clothing?: A Little 6 Click Score: 21   End of Session Nurse Communication: Mobility status  Activity Tolerance: Patient tolerated treatment well Patient left: in chair;with call bell/phone within reach;with chair alarm set;with family/visitor present  OT Visit Diagnosis: Other abnormalities of gait and mobility (R26.89)                Time: 4627-0350 OT Time Calculation (min): 22 min Charges:  OT General Charges $OT Visit: 1 Visit OT Evaluation $OT Eval Low Complexity: 1 Low OT Treatments $Self Care/Home Management : 8-22 mins  Kathie Dike, M.S. OTR/L  06/19/20, 1:32 PM  ascom [phone]

## 2020-06-20 LAB — GLUCOSE, CAPILLARY
Glucose-Capillary: 124 mg/dL — ABNORMAL HIGH (ref 70–99)
Glucose-Capillary: 100 mg/dL — ABNORMAL HIGH (ref 70–99)
Glucose-Capillary: 124 mg/dL — ABNORMAL HIGH (ref 70–99)
Glucose-Capillary: 92 mg/dL (ref 70–99)
Glucose-Capillary: 115 mg/dL — ABNORMAL HIGH (ref 70–99)

## 2020-06-20 LAB — CBC
HCT: 43.9 % (ref 39.0–52.0)
Hemoglobin: 14.8 g/dL (ref 13.0–17.0)
MCH: 29.3 pg (ref 26.0–34.0)
MCHC: 33.7 g/dL (ref 30.0–36.0)
MCV: 86.9 fL (ref 80.0–100.0)
Platelets: 190 10*3/uL (ref 150–400)
RBC: 5.05 MIL/uL (ref 4.22–5.81)
RDW: 12.4 % (ref 11.5–15.5)
WBC: 3.5 10*3/uL — ABNORMAL LOW (ref 4.0–10.5)
nRBC: 0 % (ref 0.0–0.2)

## 2020-06-20 LAB — BASIC METABOLIC PANEL
Anion gap: 9 (ref 5–15)
BUN: 19 mg/dL (ref 8–23)
CO2: 23 mmol/L (ref 22–32)
Calcium: 9.1 mg/dL (ref 8.9–10.3)
Chloride: 107 mmol/L (ref 98–111)
Creatinine, Ser: 0.98 mg/dL (ref 0.61–1.24)
GFR, Estimated: >60 mL/min (ref >60)
Glucose, Bld: 110 mg/dL — ABNORMAL HIGH (ref 70–99)
Potassium: 3.6 mmol/L (ref 3.5–5.1)
Sodium: 139 mmol/L (ref 135–145)

## 2020-06-20 MED ORDER — CLOPIDOGREL BISULFATE 75 MG PO TABS
75.0000 mg | ORAL_TABLET | Freq: Every day | ORAL | Status: DC
  Administered 2020-06-20 – 2020-06-21 (×2): 75 mg via ORAL
  Filled 2020-06-20 (×2): qty 1

## 2020-06-20 MED ORDER — AMLODIPINE BESYLATE 10 MG PO TABS
10.0000 mg | ORAL_TABLET | Freq: Every day | ORAL | Status: DC
  Administered 2020-06-20 – 2020-06-21 (×2): 10 mg via ORAL
  Filled 2020-06-20 (×2): qty 1

## 2020-06-20 MED ORDER — MELATONIN 5 MG PO TABS
5.0000 mg | ORAL_TABLET | Freq: Every day | ORAL | Status: DC
  Administered 2020-06-20: 5 mg via ORAL
  Filled 2020-06-20: qty 1

## 2020-06-20 NOTE — Progress Notes (Addendum)
Neurology Progress Note Micheal Owens MR# [id number] 06/20/2020  S: no overnight events; no new complaints. Stroke workup is complete. Wife is at bedside and helped to provide history.  O: Current vital signs: BP (!) 160/96 (BP Location: Right Arm)   Pulse (!) 55   Temp 97.8 F (36.6 C)   Resp 16   Ht 5\' 8"  (1.727 m)   Wt 84.8 kg   SpO2 100%   BMI 28.43 kg/m  Vital signs in last 24 hours: Temp:  [97.8 F (36.6 C)-98.3 F (36.8 C)] 97.8 F (36.6 C) (03/15 0742) Pulse Rate:  [55-68] 55 (03/15 0742) Resp:  [16-18] 16 (03/15 0742) BP: (144-172)/(75-101) 160/96 (03/15 0742) SpO2:  [98 %-100 %] 100 % (03/15 0742)  Exam remains unchanged as compared to yesterday. GENERAL: Awake, alert in NAD HEENT: Normocephalic and atraumatic, moist mm, no LAD, no thyromegaly LUNGS: symmetric excursions bilaterally with no audible wheezes. CV: RR, equal pulses bilaterally. ABDOMEN: Soft, nontender, nondistended with normoactive BS Ext: warm, well perfused, intact peripheral pulses. NEURO:  Mental Status: AA&Ox3  Language: speech is fluent. Intact naming, repetition, and comprehension. PERR. EOMI, visual fields full, right nasolabial fold flattening, facial sensation intact. Significant hearing loss not wearing hearing aids. No evidence of tongue atrophy or fibrillations, tongue/uvula/soft palate midline elevates symmetrically  Normal sternocleidomastoid and trapezius muscle strength.  Motor: 5/5 strength in all extremities Mild RUE pronator drift. Babinski (+)R Tone: Tone and bulk is normal. Sensation: Intact to temperature bilaterally. Coordination: FTN intact bilaterally, no ataxia in BLE. Gait - Deferred  Medications  Current Facility-Administered Medications:  .   stroke: mapping our early stages of recovery book, , Does not apply, Once, Cox, Amy N, DO .  acetaminophen (TYLENOL) tablet 325 mg, 325 mg, Oral, Q4H PRN **OR** acetaminophen (TYLENOL) 160 MG/5ML solution 650 mg, 650 mg,  Per Tube, Q4H PRN **OR** acetaminophen (TYLENOL) suppository 650 mg, 650 mg, Rectal, Q4H PRN, Cox, Amy N, DO .  aspirin chewable tablet 81 mg, 81 mg, Oral, Daily, 81 mg at 06/19/20 0924 **OR** aspirin suppository 300 mg, 300 mg, Rectal, Daily, Cox, Amy N, DO .  atorvastatin (LIPITOR) tablet 80 mg, 80 mg, Oral, Daily, Cox, Amy N, DO, 80 mg at 06/19/20 0924 .  folic acid (FOLVITE) tablet 1 mg, 1 mg, Oral, Daily, Cox, Amy N, DO, 1 mg at 06/19/20 0923 .  insulin aspart (novoLOG) injection 0-15 Units, 0-15 Units, Subcutaneous, Q4H, Cox, Amy N, DO, 2 Units at 06/20/20 0452 .  labetalol (NORMODYNE) injection 5 mg, 5 mg, Intravenous, Q2H PRN, Cox, Amy N, DO .  LORazepam (ATIVAN) tablet 1-4 mg, 1-4 mg, Oral, Q1H PRN, 1 mg at 06/19/20 1429 **OR** LORazepam (ATIVAN) injection 1-4 mg, 1-4 mg, Intravenous, Q1H PRN, Cox, Amy N, DO .  multivitamin with minerals tablet 1 tablet, 1 tablet, Oral, Daily, Cox, Amy N, DO, 1 tablet at 06/19/20 0923 .  senna-docusate (Senokot-S) tablet 1 tablet, 1 tablet, Oral, BID, Cox, Amy N, DO, 1 tablet at 06/19/20 2105 .  thiamine tablet 100 mg, 100 mg, Oral, Daily, 100 mg at 06/19/20 0924 **OR** thiamine (B-1) injection 100 mg, 100 mg, Intravenous, Daily, Cox, Amy N, DO Labs   Ref. Range 06/19/2020 06:09  HDL Cholesterol Latest Ref Range: >40 mg/dL 31 (L)  LDL (calc) Latest Ref Range: 0 - 99 mg/dL 06/21/2020 (H)  Triglycerides Latest Ref Range: <150 mg/dL 77    Ref. Range 06/18/2020 13:12  Hemoglobin A1C Latest Ref Range: 4.8 - 5.6 % 7.5 (H)  Ref. Range 06/18/2020 13:27  Cocaine Metabolite,Ur [place] Latest Ref Range: NONE DETECTED  POSITIVE (A)    Ref. Range 06/18/2020 13:27  Cannabinoid 50 Ng, Ur [place] Latest Ref Range: NONE DETECTED  POSITIVE (A)    Imaging I have reviewed images in epic and the results pertinent to this consultation are: CT Head showed hypodensities concerning for acute infarcts in the mid left frontal, mid left occipital, and superior posterior right centrum  semiovale. This distribution of potential acute infarcts suggests underlying embolic phenomenon.   CTA head and neck showed no large vessel occlusion. Intracranial atherosclerosis including severe left M1 and mild-to-moderate bilateral ICA stenoses. 55% distal left common carotid artery stenosis. Moderate right vertebral artery origin stenosis. Aortic Atherosclerosis .  MRI Brain showed patchy acute cortical and subcortical infarcts throughout the left cerebral hemisphere in the frontal, parietal, occipital, and temporal lobes involving the MCA territory and border zones. T2 hyperintensities elsewhere in the cerebral white matter bilaterally and in the pons are nonspecific but compatible with mild chronic small vessel ischemic disease. There are chronic lacunar infarcts in the basal ganglia, thalami, and pons.   Echocardiogram read as EF 60-65%, left ventricle has no regional wall motion abnormalities, PFO or LV thrombus.  Assessment:  70 year old right handed man with above past medical history including depressiveness abuse, hypertension, hyperlipidemia, DM 2, alcohol, cannabis, cocaine abuse with 3 days of increasing confusion, difficulty forming words and using his right hand. Arrived outside the window for tPA and found to have acute embolic strokes. He feels like he is back to baseline.   His wife added though he does use cocaine and cannabis, he does not use injectable narcotics and CTA did not suggest mycotic aneurysms.  Imaging showed acute embolic strokes likely artery to artery secondary to the tachycardic, blood pressure and vasospastic effects of cocaine in setting of preexisting vascular risk factors.  Impression:  Acute ischemic stroke - Multifocal left MCA atheroembolic.  Encephalopathy - Resolved.  Polysubstance abuse (cocaine, cannabis).  Intracranial atherosclerosis.  Hypertension.  Hyperlipidemia.  DM 2 (04/22/2019).  PAD.  Carotid artery  atherosclerosis.  Aortic Atherosclerosis (ICD10-I70.0).  Alcohol abuse-recently quit-no reported seizure activity.  Significant hearing loss.  Occasional medication non-adherence (noted OSH chart review).   Recommendations:   Chart review indicates his PCP recently switched the patient from aspirin to clopidogrel and will continue plan. 1. Continue aspirin 81mg  for 3 weeks then stop.   2. Continue clopidogrel 75mg  daily.  Had a detailed conversation with the patient and his wife regarding the detrimental effects of cocaine and strongly advised the patient to stop.  Better glycemic control for A1c<7.  PT/OT/speech therapy  Continue thiamine PO for now.  Consider Holter monitor on discharge based on chronic bilateral thalamic and brainstem strokes.  Please call neurology for questions.   Electronically signed by:  , MD Page: 06/20/2020, 9:15 AM

## 2020-06-20 NOTE — Progress Notes (Addendum)
PROGRESS NOTE   HPI was taken from Dr. Sedalia Muta: Micheal Owens is a 70 y.o. right-handed male with medical history significant for hypertension, hyperlipidemia, non-insulin-dependent diabetes mellitus, PAD, previous daily alcohol use, polysubstance abuse including crack cocaine and THC, presented to the emergency department for chief concerns of confusion and right upper extremity weakness via private vehicle, spouse.  [name] at bedside was able to tell me his name, age with encouragement (initially 18 and debated for a while and after I told him that he is not in his 35s, he correctly said 15), he told me the year was 2002, and that he is in the hospital.  He was able to identify his wife at bedside.  Patient does not know why he is in the hospital.  He does endorse right upper extremity weakness.  He reports that he has never felt this way before.  He denies history of strokes, seizures, DTs.  Per spouse at bedside, she endorsed that he had difficulty understanding his spouse since Thursday, 06/15/20.   He denies tobacco use. He uses crack cocaine, smoking twice per week (last time was Friday, 06/16/2020). He smokes marijuana every day, last used marijuana with 06/18/2020, prior to coming to the hospital.    Social history: He worked in [employer] and is currently retired.  [place], he endorses drinking 1 pint of etoh per day and quit 2 weeks ago. He has not had a drop of etoh for two weeks.  He denies history of tobacco use.  He endorses using crack cocaine and THC as above.  Vaccination: Per spouse, Mr. Micheal Owens is vaccinated with Moderna for three doses.  I was not able to find records of these in the [facility].  On physical exam at bedside, patient has persistent right upper extremity weakness, confusion, bradyphrenia, and tongue is deviated to the right.  Per spouse, no metal implants in the body.   Micheal Owens  WSF:[id number] DOB: [birth year] DOA: 06/18/2020 [facility]   Assessment  & Plan:   Principal Problem:   Stroke-like episode Active Problems:   HTN (hypertension)   Type 2 diabetes mellitus with hyperlipidemia (HCC)   PAD (peripheral artery disease) (HCC)   History of BPH   History of alcohol abuse   Stroke [facility])   CVA (cerebral vascular accident) (HCC)   CVA: concern for acute infarcts in the mid left frontal, mid left occipital, superior posterior right centrum semiovale.  Distribution of potential acute infarct suggest underlying embolic phenomenon.  No mass or hemorrhage evident. MRI brain shows patch acute left cerebral hemispheric infracts in the MCA territory. CTA head/neck show no large vessel occlusion, severe left M1 & mild to mod b/l ICA stenosis, moderate right vertebral artery origin stenosis. Echo pending. Continue on aspirin x 3 weeks & restart plavix as per neuro  Neuro following and recs apprec  HTN: restart home dose of amlodipine   Hypokalemia: WNL today   DM2: likely poorly controlled. Continue on SSI w/ accuchecks  Cocaine & marijuana abuse: illicit drug abuse cessation counseling  Alcohol abuse: continue on CIWA protocol. Quit 2 weeks ago  PAD: likely secondary to cocaine use. Continue on statin  HLD: continue on statin   DVT prophylaxis: SCDs Code Status: full  Family Communication: discussed pt's care w/ pt's family at bedside and answered their quesitons Disposition Plan:  Likely d/c back home, waiting on echo   Status is: Inpatient  Remains inpatient appropriate because:Ongoing diagnostic testing needed not appropriate for outpatient work up  and Inpatient level of care appropriate due to severity of illness waiting on echo    Dispo: The patient is from: Home              Anticipated d/c is to: Home              Patient currently is not medically stable to d/c.   Difficult to place patient Yes    Consultants:   neuro   Procedures:    Antimicrobials:   Subjective: Pt c/o malaise  Objective: Vitals:    06/19/20 2053 06/19/20 2355 06/20/20 0446 06/20/20 0742  BP: (!) 161/101 (!) 144/91 (!) 145/75 (!) 160/96  Pulse: 68 60 (!) 58 (!) 55  Resp: Temp: 98.3 F (36.8 C) 97.8 F (36.6 C) 97.9 F (36.6 C) 97.8 F (36.6 C)  TempSrc:      SpO2: 100% 98% 99% 100%  Weight:      Height:        Intake/Output Summary (Last 24 hours) at 06/20/2020 0824 Last data filed at 06/19/2020 2200 Gross per 24 hour  Intake 360 ml  Output --  Net 360 ml   Filed Weights   06/18/20 1215  Weight: 84.8 kg    Examination:  General exam: Disheveled. Multiple rotten & missing teeth  Respiratory system: clear breath sounds b/l  Cardiovascular system: S1/S2+. No rubs or gallops Gastrointestinal system: Abd is soft, NT, obese & hypoactive bowel sounds  Central nervous system: Alert and oriented. Moves all 4 extremities  Psychiatry: Judgement and insight appear abnormal. Flat mood and affect    Data Reviewed: I have personally reviewed following labs and imaging studies  CBC: Recent Labs  Lab 06/18/20 1227 06/19/20 0927 06/20/20 0630  WBC 3.7* 4.4 3.5*  NEUTROABS 2.6  --   --   HGB 14.9 15.3 14.8  HCT 46.1 45.4 43.9  MCV 89.2 86.3 86.9  PLT 194 208 190   Basic Metabolic Panel: Recent Labs  Lab 06/18/20 1227 06/18/20 1514 06/19/20 0927 06/20/20 0630  NA 137  --  139 139  K 3.7  --  3.4* 3.6  CL 104  --  107 107  CO2 24  --  23 23  GLUCOSE 211*  --  139* 110*  BUN 19  --  14 19  CREATININE 1.02  --  0.92 0.98  CALCIUM 9.3  --  9.3 9.1  MG  --  2.0  --   --   PHOS  --  3.6  --   --    GFR: Estimated Creatinine Clearance: 75.5 mL/min (by C-G formula based on SCr of 0.98 mg/dL). Liver Function Tests: Recent Labs  Lab 06/18/20 1227  AST 24  ALT 20  ALKPHOS 52  BILITOT 0.8  PROT 7.1  ALBUMIN 4.5   No results for input(s): LIPASE, AMYLASE in the last 168 hours. No results for input(s): AMMONIA in the last 168 hours. Coagulation Profile: Recent Labs  Lab  06/18/20 1227  INR 1.1   Cardiac Enzymes: No results for input(s): CKTOTAL, CKMB, CKMBINDEX, TROPONINI in the last 168 hours. BNP (last 3 results) No results for input(s): PROBNP in the last 8760 hours. HbA1C: Recent Labs    06/18/20 1312  HGBA1C 7.5*   CBG: Recent Labs  Lab 06/19/20 1712 06/19/20 2053 06/19/20 2353 06/20/20 0444 06/20/20 0744  GLUCAP 112* 114* 111* 124* 100*   Lipid Profile: Recent Labs    06/19/20 0609  CHOL 158  HDL  31*  LDLCALC 112*  TRIG 77  CHOLHDL 5.1   Thyroid Function Tests: No results for input(s): TSH, T4TOTAL, FREET4, T3FREE, THYROIDAB in the last 72 hours. Anemia Panel: No results for input(s): VITAMINB12, FOLATE, FERRITIN, TIBC, IRON, RETICCTPCT in the last 72 hours. Sepsis Labs: No results for input(s): PROCALCITON, LATICACIDVEN in the last 168 hours.  Recent Results (from the past 240 hour(s))  Resp Panel by RT-PCR (Flu A&B, Covid) Nasopharyngeal Swab     Status: None   Collection Time: 06/18/20  1:27 PM   Specimen: Nasopharyngeal Swab; Nasopharyngeal(NP) swabs in vial transport medium  Result Value Ref Range Status   SARS Coronavirus 2 by RT PCR NEGATIVE NEGATIVE Final    Comment: (NOTE) SARS-CoV-2 target nucleic acids are NOT DETECTED.  The SARS-CoV-2 RNA is generally detectable in upper respiratory specimens during the acute phase of infection. The lowest concentration of SARS-CoV-2 viral copies this assay can detect is 138 copies/mL. A negative result does not preclude SARS-Cov-2 infection and should not be used as the sole basis for treatment or other patient management decisions. A negative result may occur with  improper specimen collection/handling, submission of specimen other than nasopharyngeal swab, presence of viral mutation(s) within the areas targeted by this assay, and inadequate number of viral copies(<138 copies/mL). A negative result must be combined with clinical observations, patient history, and  epidemiological information. The expected result is Negative.  Fact Sheet for Patients:  BloggerCourse.com  Fact Sheet for Healthcare Providers:  SeriousBroker.it  This test is no t yet approved or cleared by the [place] FDA and  has been authorized for detection and/or diagnosis of SARS-CoV-2 by FDA under an Emergency Use Authorization (EUA). This EUA will remain  in effect (meaning this test can be used) for the duration of the COVID-19 declaration under Section 564(b)(1) of the Act, 21 U.S.C.section 360bbb-3(b)(1), unless the authorization is terminated  or revoked sooner.       Influenza A by PCR NEGATIVE NEGATIVE Final   Influenza B by PCR NEGATIVE NEGATIVE Final    Comment: (NOTE) The Xpert Xpress SARS-CoV-2/FLU/RSV plus assay is intended as an aid in the diagnosis of influenza from Nasopharyngeal swab specimens and should not be used as a sole basis for treatment. Nasal washings and aspirates are unacceptable for Xpert Xpress SARS-CoV-2/FLU/RSV testing.  Fact Sheet for Patients: BloggerCourse.com  Fact Sheet for Healthcare Providers: SeriousBroker.it  This test is not yet approved or cleared by the [place] FDA and has been authorized for detection and/or diagnosis of SARS-CoV-2 by FDA under an Emergency Use Authorization (EUA). This EUA will remain in effect (meaning this test can be used) for the duration of the COVID-19 declaration under Section 564(b)(1) of the Act, 21 U.S.C. section 360bbb-3(b)(1), unless the authorization is terminated or revoked.  Performed at [facility], [address], [place], [place] [address]          Radiology Studies: CT Angio Head W or Wo Contrast  Result Date: 06/18/2020 CLINICAL DATA:  Right arm weakness, confusion, and word-finding difficulty. EXAM: CT ANGIOGRAPHY HEAD AND NECK TECHNIQUE: Multidetector  CT imaging of the head and neck was performed using the standard protocol during bolus administration of intravenous contrast. Multiplanar CT image reconstructions and MIPs were obtained to evaluate the vascular anatomy. Carotid stenosis measurements (when applicable) are obtained utilizing NASCET criteria, using the distal internal carotid diameter as the denominator. CONTRAST:  29mL OMNIPAQUE IOHEXOL 350 MG/ML SOLN COMPARISON:  None. FINDINGS: CTA NECK FINDINGS Aortic arch:  Standard 3 a cell aortic arch with mild atherosclerotic plaque. No significant arch vessel origin stenosis. Right carotid system: Patent with moderate calcified plaque at the carotid bifurcation. No evidence of a significant stenosis or dissection. Tortuous distal cervical ICA. Left carotid system: Patent with extensive calcified plaque at the carotid bifurcation resulting in 55% stenosis of the distal common carotid artery. No significant ICA stenosis. Tortuous proximal ICA. Vertebral arteries: The vertebral arteries are patent and codominant without evidence of a significant stenosis on the left within limitations of artifact through the origin. Calcified plaque results in moderate stenosis of the right vertebral artery origin and mild stenosis of the right V1 segment distal to the origin. Skeleton: Moderate disc degeneration at C5-6. Other neck: No evidence of cervical lymphadenopathy or mass. Upper chest: Clear lung apices. Review of the MIP images confirms the above findings CTA HEAD FINDINGS Anterior circulation: The internal carotid arteries are patent from skull base to carotid termini with prominently calcified plaque resulting in likely mild to moderate cavernous and mild supraclinoid stenosis bilaterally with assessment limited by blooming from the dense calcification. ACAs and MCAs are patent without evidence of a proximal branch occlusion or significant A1 or right M1 stenosis. There is a severe proximal left M1 stenosis with mild  asymmetric diffuse attenuation of left MCA branch vessels compared to the right. The left ACA is dominant. No aneurysm is identified. Posterior circulation: The intracranial vertebral arteries are patent to the basilar with prominent calcified plaque in the right V4 segment resulting in mild stenosis. Mild left V4 segment plaque does not result in significant stenosis. Patent left PICA, right AICA, and bilateral SCA origins are identified. The basilar artery is widely patent. Posterior communicating arteries are diminutive or absent. The PCAs are patent with mild irregular narrowing of the left greater than right P2 segments. No aneurysm is identified. Venous sinuses: As permitted by contrast timing, patent. Anatomic variants: Dominant left ACA. Review of the MIP images confirms the above findings IMPRESSION: 1. No large vessel occlusion. 2. Intracranial atherosclerosis including severe left M1 and mild-to-moderate bilateral ICA stenoses. 3. 55% distal left common carotid artery stenosis. 4. Moderate right vertebral artery origin stenosis. 5. Aortic Atherosclerosis (ICD10-I70.0). Electronically Signed   By: Sebastian Ache M.D.   On: 06/18/2020 14:40   CT HEAD WO CONTRAST  Result Date: 06/18/2020 CLINICAL DATA:  Altered mental status EXAM: CT HEAD WITHOUT CONTRAST TECHNIQUE: Contiguous axial images were obtained from the base of the skull through the vertex without intravenous contrast. COMPARISON:  None. FINDINGS: Brain: Ventricles and sulci are normal in size and configuration. There is no intracranial mass, hemorrhage, extra-axial fluid collection, midline shift. There is decreased attenuation in the mid left frontal lobe consistent with a recent and potentially acute infarct. A similar appearing area of decreased attenuation is noted in the mid left occipital lobe, concerning for recent and potentially acute infarct. There is a focus of decreased attenuation in the posterior right centrum semiovale which may  represent a recent and possibly acute white matter infarct in this area. Vascular: No hyperdense vessel. There is calcification in each distal vertebral artery and carotid siphon region. Skull: Bony calvarium appears intact. Sinuses/Orbits: Mucosal thickening noted in several ethmoid air cells and in the anterior sphenoid sinus regions. Visualized orbits appear symmetric bilaterally. Other: Mastoid air cells are clear. IMPRESSION: Concern for acute infarcts in the mid left frontal, mid left occipital, and superior posterior right centrum semiovale. This distribution of potential acute infarcts suggests underlying embolic  phenomenon. No mass or hemorrhage evident. Multiple foci of arterial vascular calcification noted. There is mucosal thickening in several paranasal sinus regions. Electronically Signed   By: Bretta Bang III M.D.   On: 06/18/2020 12:56   CT Angio Neck W and/or Wo Contrast  Result Date: 06/18/2020 CLINICAL DATA:  Right arm weakness, confusion, and word-finding difficulty. EXAM: CT ANGIOGRAPHY HEAD AND NECK TECHNIQUE: Multidetector CT imaging of the head and neck was performed using the standard protocol during bolus administration of intravenous contrast. Multiplanar CT image reconstructions and MIPs were obtained to evaluate the vascular anatomy. Carotid stenosis measurements (when applicable) are obtained utilizing NASCET criteria, using the distal internal carotid diameter as the denominator. CONTRAST:  75mL OMNIPAQUE IOHEXOL 350 MG/ML SOLN COMPARISON:  None. FINDINGS: CTA NECK FINDINGS Aortic arch: Standard 3 a cell aortic arch with mild atherosclerotic plaque. No significant arch vessel origin stenosis. Right carotid system: Patent with moderate calcified plaque at the carotid bifurcation. No evidence of a significant stenosis or dissection. Tortuous distal cervical ICA. Left carotid system: Patent with extensive calcified plaque at the carotid bifurcation resulting in 55% stenosis of  the distal common carotid artery. No significant ICA stenosis. Tortuous proximal ICA. Vertebral arteries: The vertebral arteries are patent and codominant without evidence of a significant stenosis on the left within limitations of artifact through the origin. Calcified plaque results in moderate stenosis of the right vertebral artery origin and mild stenosis of the right V1 segment distal to the origin. Skeleton: Moderate disc degeneration at C5-6. Other neck: No evidence of cervical lymphadenopathy or mass. Upper chest: Clear lung apices. Review of the MIP images confirms the above findings CTA HEAD FINDINGS Anterior circulation: The internal carotid arteries are patent from skull base to carotid termini with prominently calcified plaque resulting in likely mild to moderate cavernous and mild supraclinoid stenosis bilaterally with assessment limited by blooming from the dense calcification. ACAs and MCAs are patent without evidence of a proximal branch occlusion or significant A1 or right M1 stenosis. There is a severe proximal left M1 stenosis with mild asymmetric diffuse attenuation of left MCA branch vessels compared to the right. The left ACA is dominant. No aneurysm is identified. Posterior circulation: The intracranial vertebral arteries are patent to the basilar with prominent calcified plaque in the right V4 segment resulting in mild stenosis. Mild left V4 segment plaque does not result in significant stenosis. Patent left PICA, right AICA, and bilateral SCA origins are identified. The basilar artery is widely patent. Posterior communicating arteries are diminutive or absent. The PCAs are patent with mild irregular narrowing of the left greater than right P2 segments. No aneurysm is identified. Venous sinuses: As permitted by contrast timing, patent. Anatomic variants: Dominant left ACA. Review of the MIP images confirms the above findings IMPRESSION: 1. No large vessel occlusion. 2. Intracranial  atherosclerosis including severe left M1 and mild-to-moderate bilateral ICA stenoses. 3. 55% distal left common carotid artery stenosis. 4. Moderate right vertebral artery origin stenosis. 5. Aortic Atherosclerosis (ICD10-I70.0). Electronically Signed   By: Sebastian Ache M.D.   On: 06/18/2020 14:40   MR BRAIN WO CONTRAST  Result Date: 06/18/2020 CLINICAL DATA:  Confusion and right-sided weakness. EXAM: MRI HEAD WITHOUT CONTRAST TECHNIQUE: Multiplanar, multiecho pulse sequences of the brain and surrounding structures were obtained without intravenous contrast. COMPARISON:  Head CT and CTA 06/18/2020 FINDINGS: Brain: There are patchy acute cortical and subcortical infarcts throughout the left cerebral hemisphere in the frontal, parietal, occipital, and temporal lobes involving the MCA  territory and border zones. No intracranial hemorrhage, mass, midline shift, or extra-axial fluid collection is identified. T2 hyperintensities elsewhere in the cerebral white matter bilaterally and in the pons are nonspecific but compatible with mild chronic small vessel ischemic disease. There are chronic lacunar infarcts in the basal ganglia, thalami, and pons. The ventricles and sulci are within normal limits for age. Vascular: Major intracranial vascular flow voids are preserved. Skull and upper cervical spine: Unremarkable bone marrow signal. Sinuses/Orbits: Unremarkable orbits. Small left maxillary sinus mucous retention cyst. Clear mastoid air cells. Other: None. IMPRESSION: 1. Patchy acute left cerebral hemispheric infarcts in the MCA territory and border zones. 2. Chronic small vessel ischemic disease with multiple chronic lacunar infarcts. Electronically Signed   By: Sebastian Ache M.D.   On: 06/18/2020 17:41   ECHOCARDIOGRAM COMPLETE BUBBLE STUDY  Result Date: 06/19/2020    ECHOCARDIOGRAM REPORT   Patient Name:   IFEOLUWA BELLER Date of Exam: 06/19/2020 Medical Rec #:  [id number]    Height:       68.0 in Accession #:     [id number]   Weight:       187.0 lb Date of Birth:  November 11, 1950    BSA:          1.986 m Patient Age:    69 years     BP:           172/92 mmHg Patient Gender: M            HR:           65 bpm. Exam Location:  [facility] Procedure: 2D Echo, Cardiac Doppler, Color Doppler and Saline Contrast Bubble            Study Indications:     Stroke 434.91 / I63.9  History:         Patient has no prior history of Echocardiogram examinations.                  Risk Factors:Hypertension. PVD.  Sonographer:     Cristela Blue RDCS (AE) Referring Phys:  [id number] AMY N [facility] Diagnosing Phys: Lorine Bears MD  Sonographer Comments: Technically challenging study due to limited acoustic windows, suboptimal parasternal window and suboptimal apical window. IMPRESSIONS  1. Left ventricular ejection fraction, by estimation, is 60 to 65%. The left ventricle has normal function. The left ventricle has no regional wall motion abnormalities. There is moderate left ventricular hypertrophy. Left ventricular diastolic parameters are consistent with Grade I diastolic dysfunction (impaired relaxation).  2. Right ventricular systolic function is normal. The right ventricular size is normal. Tricuspid regurgitation signal is inadequate for assessing PA pressure.  3. Left atrial size was mildly dilated.  4. Right atrial size was mildly dilated.  5. The mitral valve is normal in structure. No evidence of mitral valve regurgitation. No evidence of mitral stenosis.  6. The aortic valve is normal in structure. Aortic valve regurgitation is not visualized. Mild to moderate aortic valve sclerosis/calcification is present, without any evidence of aortic stenosis.  7. The inferior vena cava is normal in size with greater than 50% respiratory variability, suggesting right atrial pressure of 3 mmHg.  8. Agitated saline contrast bubble study was negative, with no evidence of any interatrial shunt. FINDINGS  Left Ventricle: Left ventricular ejection fraction, by estimation,  is 60 to 65%. The left ventricle has normal function. The left ventricle has no regional wall motion abnormalities. The left ventricular internal cavity size was normal in size. There is  moderate  left ventricular hypertrophy. Left ventricular diastolic parameters are consistent with Grade I diastolic dysfunction (impaired relaxation). Right Ventricle: The right ventricular size is normal. No increase in right ventricular wall thickness. Right ventricular systolic function is normal. Tricuspid regurgitation signal is inadequate for assessing PA pressure. Left Atrium: Left atrial size was mildly dilated. Right Atrium: Right atrial size was mildly dilated. Pericardium: There is no evidence of pericardial effusion. Mitral Valve: The mitral valve is normal in structure. No evidence of mitral valve regurgitation. No evidence of mitral valve stenosis. Tricuspid Valve: The tricuspid valve is normal in structure. Tricuspid valve regurgitation is not demonstrated. No evidence of tricuspid stenosis. Aortic Valve: The aortic valve is normal in structure. Aortic valve regurgitation is not visualized. Mild to moderate aortic valve sclerosis/calcification is present, without any evidence of aortic stenosis. Aortic valve mean gradient measures 4.0 mmHg. Aortic valve peak gradient measures 6.9 mmHg. Aortic valve area, by VTI measures 2.43 cm. Pulmonic Valve: The pulmonic valve was normal in structure. Pulmonic valve regurgitation is not visualized. No evidence of pulmonic stenosis. Aorta: The aortic root is normal in size and structure. Venous: The inferior vena cava is normal in size with greater than 50% respiratory variability, suggesting right atrial pressure of 3 mmHg. IAS/Shunts: No atrial level shunt detected by color flow Doppler. Agitated saline contrast was given intravenously to evaluate for intracardiac shunting. Agitated saline contrast bubble study was negative, with no evidence of any interatrial shunt.  LEFT  VENTRICLE PLAX 2D LVIDd:         3.85 cm  Diastology LVIDs:         2.81 cm  LV e' medial:    5.98 cm/s LV PW:         1.44 cm  LV E/e' medial:  11.3 LV IVS:        1.31 cm  LV e' lateral:   9.46 cm/s LVOT diam:     2.10 cm  LV E/e' lateral: 7.2 LV SV:         61 LV SV Index:   31 LVOT Area:     3.46 cm  RIGHT VENTRICLE RV Basal diam:  2.56 cm LEFT ATRIUM             Index       RIGHT ATRIUM           Index LA diam:        4.70 cm 2.37 cm/m  RA Area:     20.60 cm LA Vol (A2C):   63.4 ml 31.92 ml/m RA Volume:   56.00 ml  28.19 ml/m LA Vol (A4C):   58.4 ml 29.40 ml/m LA Biplane Vol: 61.8 ml 31.11 ml/m  AORTIC VALVE                   PULMONIC VALVE AV Area (Vmax):    2.28 cm    PV Vmax:        1.16 m/s AV Area (Vmean):   2.25 cm    PV Peak grad:   5.4 mmHg AV Area (VTI):     2.43 cm    RVOT Peak grad: 8 mmHg AV Vmax:           131.00 cm/s AV Vmean:          88.400 cm/s AV VTI:            0.249 m AV Peak Grad:      6.9 mmHg AV Mean Grad:  4.0 mmHg LVOT Vmax:         86.20 cm/s LVOT Vmean:        57.400 cm/s LVOT VTI:          0.175 m LVOT/AV VTI ratio: 0.70  AORTA Ao Root diam: 3.65 cm MITRAL VALVE               TRICUSPID VALVE MV Area (PHT): 2.63 cm    TR Peak grad:   13.4 mmHg MV Decel Time: 288 msec    TR Vmax:        183.00 cm/s MV E velocity: 67.70 cm/s MV A velocity: 90.40 cm/s  SHUNTS MV E/A ratio:  0.75        Systemic VTI:  0.18 m                            Systemic Diam: 2.10 cm Lorine Bears MD Electronically signed by Lorine Bears MD Signature Date/Time: 06/19/2020/3:27:01 PM    Final         Scheduled Meds: .  stroke: mapping our early stages of recovery book   Does not apply Once  . aspirin  81 mg Oral Daily   Or  . aspirin  300 mg Rectal Daily  . atorvastatin  80 mg Oral Daily  . folic acid  1 mg Oral Daily  . insulin aspart  0-15 Units Subcutaneous Q4H  . multivitamin with minerals  1 tablet Oral Daily  . senna-docusate  1 tablet Oral BID  . thiamine  100 mg Oral Daily    Or  . thiamine  100 mg Intravenous Daily   Continuous Infusions:   LOS: 1 day    Time spent: 30 mins    Charise Killian, MD [facility] Pager [phone]  If 7PM-7AM, please contact night-coverage 06/20/2020, 8:24 AM

## 2020-06-21 ENCOUNTER — Inpatient Hospital Stay (HOSPITAL_COMMUNITY)
Admit: 2020-06-21 | Discharge: 2020-06-21 | Disposition: A | Discharge status: Home / Self Care | Payer: Medicare Other | Attending: Nurse Practitioner | Admitting: Nurse Practitioner

## 2020-06-21 ENCOUNTER — Other Ambulatory Visit: Payer: Self-pay | Admitting: Nurse Practitioner

## 2020-06-21 DIAGNOSIS — F1410 Cocaine abuse, uncomplicated: Secondary | ICD-10-CM

## 2020-06-21 DIAGNOSIS — I739 Peripheral vascular disease, unspecified: Secondary | ICD-10-CM

## 2020-06-21 DIAGNOSIS — E785 Hyperlipidemia, unspecified: Secondary | ICD-10-CM

## 2020-06-21 DIAGNOSIS — I639 Cerebral infarction, unspecified: Secondary | ICD-10-CM

## 2020-06-21 DIAGNOSIS — E1169 Type 2 diabetes mellitus with other specified complication: Secondary | ICD-10-CM

## 2020-06-21 LAB — BASIC METABOLIC PANEL
Anion gap: 11 (ref 5–15)
BUN: 19 mg/dL (ref 8–23)
CO2: 23 mmol/L (ref 22–32)
Calcium: 9.1 mg/dL (ref 8.9–10.3)
Chloride: 105 mmol/L (ref 98–111)
Creatinine, Ser: 1 mg/dL (ref 0.61–1.24)
GFR, Estimated: >60 mL/min (ref >60)
Glucose, Bld: 107 mg/dL — ABNORMAL HIGH (ref 70–99)
Potassium: 3.4 mmol/L — ABNORMAL LOW (ref 3.5–5.1)
Sodium: 139 mmol/L (ref 135–145)

## 2020-06-21 LAB — GLUCOSE, CAPILLARY
Glucose-Capillary: 110 mg/dL — ABNORMAL HIGH (ref 70–99)
Glucose-Capillary: 111 mg/dL — ABNORMAL HIGH (ref 70–99)
Glucose-Capillary: 120 mg/dL — ABNORMAL HIGH (ref 70–99)
Glucose-Capillary: 121 mg/dL — ABNORMAL HIGH (ref 70–99)

## 2020-06-21 LAB — CBC
HCT: 48.5 % (ref 39.0–52.0)
Hemoglobin: 16.1 g/dL (ref 13.0–17.0)
MCH: 29.2 pg (ref 26.0–34.0)
MCHC: 33.2 g/dL (ref 30.0–36.0)
MCV: 88 fL (ref 80.0–100.0)
Platelets: 195 10*3/uL (ref 150–400)
RBC: 5.51 MIL/uL (ref 4.22–5.81)
RDW: 12.1 % (ref 11.5–15.5)
WBC: 3.8 10*3/uL — ABNORMAL LOW (ref 4.0–10.5)
nRBC: 0 % (ref 0.0–0.2)

## 2020-06-21 MED ORDER — THIAMINE HCL 100 MG PO TABS: 100.0000 mg | ORAL_TABLET | Freq: Every day | ORAL | 0 refills | Status: DC

## 2020-06-21 MED ORDER — AMLODIPINE BESYLATE 10 MG PO TABS: 10.0000 mg | ORAL_TABLET | Freq: Every day | ORAL | 0 refills | Status: AC

## 2020-06-21 MED ORDER — LORAZEPAM 0.5 MG PO TABS: 0.5000 mg | ORAL_TABLET | Freq: Two times a day (BID) | ORAL | 0 refills | Status: DC | PRN

## 2020-06-21 MED ORDER — CLOPIDOGREL BISULFATE 75 MG PO TABS: 75.0000 mg | ORAL_TABLET | Freq: Every day | ORAL | 0 refills | Status: AC

## 2020-06-21 MED ORDER — ASPIRIN 81 MG PO CHEW: 81.0000 mg | CHEWABLE_TABLET | Freq: Every day | ORAL | 0 refills | Status: AC

## 2020-06-21 MED ORDER — ATORVASTATIN CALCIUM 80 MG PO TABS: 80.0000 mg | ORAL_TABLET | Freq: Every day | ORAL | 0 refills | Status: AC

## 2020-06-21 MED ORDER — FOLIC ACID 1 MG PO TABS: 1.0000 mg | ORAL_TABLET | Freq: Every day | ORAL | 0 refills | Status: DC

## 2020-06-21 MED ORDER — MELATONIN 5 MG PO TABS: 5.0000 mg | ORAL_TABLET | Freq: Every day | ORAL | 0 refills | Status: DC

## 2020-06-21 NOTE — TOC Transition Note (Signed)
Transition of Care [facility]) - CM/SW Discharge Note   Patient Details  Name: Micheal Owens MRN: [id number] Date of Birth: 1950/09/05  [facility] CM/SW Contact:  Caryn Section, RN Phone Number: 06/21/2020, 10:28 AM   Clinical Narrative:   Patient being discharge to home today.  No further concerns.  TOC signing off    Final next level of care: Home/Self Care Barriers to Discharge: Barriers Resolved   Patient Goals and CMS Choice Patient states their goals for this hospitalization and ongoing recovery are:: "Go home to my wife."   Choice offered to / list presented to : NA  Discharge Placement                    Patient and family notified of of transfer: 06/21/20  Discharge Plan and Services                                     Social Determinants of Health (SDOH) Interventions     Readmission Risk Interventions No flowsheet data found.

## 2020-06-21 NOTE — Progress Notes (Signed)
Physical Therapy Treatment Patient Details Name: Micheal Owens MRN: [id number] DOB: 25-Nov-1950 Today's Date: 06/21/2020    History of Present Illness Pt is a 70 y.o. male presenting to hospital 3/13 with weakness R arm, confusion, and difficulty with speech (expressive and receptive aphasia).  CT of brain showing "hypodensities indicating potentially subacute infarct in the mid left frontal mid left occipital and superior posterior right centrum semiovale".  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   PMH includes htn, HDL, PVD on Plavix, h/o alcohol abuse, polysubstance abuse, DM type 2.    PT Comments    Pt walking around his room independently upon PT arrival; pt agreeable to PT session.  Pt independent with transfers; independent with ambulation 400 feet (no AD); and modified independent with stairs navigation (4 steps with R railing).  BP 168/100 post activity and after a few minutes sitting rest break BP 173/92 (nurse notified); HR and O2 sats on room air WFL.  No loss of balance with ambulation and head turns R/L/up/down, increasing/decreasing speed, and turning and stopping.  Pt appears steady and safe with all functional mobility and all PT goals met; d/t this will discharge pt in house.  Pt and pt's nurse report plan for pt to discharge home today.    Follow Up Recommendations  No PT follow up     Equipment Recommendations  None recommended by PT    Recommendations for Other Services OT consult     Precautions / Restrictions Precautions Precaution Comments: Aspiration Restrictions Weight Bearing Restrictions: No    Mobility  Bed Mobility               General bed mobility comments: Deferred (pt walking around in room upon PT arrival)    Transfers Overall transfer level: Independent Equipment used: None Transfers: Sit to/from Stand Sit to Stand: Independent         General transfer comment: steady safe transfers  noted  Ambulation/Gait Ambulation/Gait assistance: Independent Gait Distance (Feet): 400 Feet Assistive device: None Gait Pattern/deviations: Step-through pattern     General Gait Details: very mild decreased R heelstrike   Stairs Stairs: Yes Stairs assistance: Modified independent (Device/Increase time) Stair Management: One rail Right;Alternating pattern;Forwards Number of Stairs: 4 General stair comments: steady safe stairs navigation   Wheelchair Mobility    Modified Rankin (Stroke Patients Only)       Balance Overall balance assessment: Needs assistance Sitting-balance support: No upper extremity supported;Feet supported Sitting balance-Leahy Scale: Normal Sitting balance - Comments: steady sitting reaching outside BOS   Standing balance support: No upper extremity supported;During functional activity Standing balance-Leahy Scale: Normal Standing balance comment: no loss of balance with ambulation and dynamic standing activities                            Cognition Arousal/Alertness: Awake/alert Behavior During Therapy: WFL for tasks assessed/performed Overall Cognitive Status: Impaired/Different from baseline Area of Impairment: Orientation                 Orientation Level: Time;Situation                    Exercises      General Comments   Nursing cleared pt for participation in physical therapy.  Pt agreeable to PT session.      Pertinent Vitals/Pain Pain Assessment: No/denies pain  HR and O2 sats on room air stable and WFL throughout treatment session.  Home Living                      Prior Function            PT Goals (current goals can now be found in the care plan section) Acute Rehab PT Goals Patient Stated Goal: to go home PT Goal Formulation: With patient Time For Goal Achievement: 07/03/20 Potential to Achieve Goals: Good Progress towards PT goals: Goals met/education completed, patient  discharged from PT    Frequency    Min 2X/week      PT Plan Other (comment) (DC PT in house (pt met all goals))    Co-evaluation              AM-PAC PT "6 Clicks" Mobility   Outcome Measure  Help needed turning from your back to your side while in a flat bed without using bedrails?: None Help needed moving from lying on your back to sitting on the side of a flat bed without using bedrails?: None Help needed moving to and from a bed to a chair (including a wheelchair)?: None Help needed standing up from a chair using your arms (e.g., wheelchair or bedside chair)?: None Help needed to walk in hospital room?: None Help needed climbing 3-5 steps with a railing? : None 6 Click Score: 24    End of Session Equipment Utilized During Treatment: Gait belt Activity Tolerance: Patient tolerated treatment well Patient left: with call bell/phone within reach (sitting on edge of bed) Nurse Communication: Mobility status;Precautions;Other (comment) (pt's elevated BP) PT Visit Diagnosis: Other abnormalities of gait and mobility (R26.89);Hemiplegia and hemiparesis Hemiplegia - Right/Left: Right Hemiplegia - caused by: Cerebral infarction     Time: 9971-8209 PT Time Calculation (min) (ACUTE ONLY): 23 min  Charges:  $Gait Training: 8-22 mins $Therapeutic Activity: 8-22 mins                    Leitha Bleak, PT 06/21/20, 9:32 AM

## 2020-06-21 NOTE — Progress Notes (Signed)
Pt discharged home with wife. Nurse went over discharge instructions with pt and wife and showed understanding, no questions at that time. Took IV out of left forearm without complications. Pt took home medications with him at time of discharge. Pt took all belongings.  Wheeled down to medical mall by volunteer services.

## 2020-06-21 NOTE — Discharge Summary (Signed)
[facility] [place] [facility]   PATIENT NAME: Micheal Owens    MR#:  [id number]  DATE OF BIRTH:  29-Dec-1950  DATE OF ADMISSION:  06/18/2020 ADMITTING PHYSICIAN: Charise Killian, MD  DATE OF DISCHARGE: 06/21/2020 12:54 PM  PRIMARY CARE PHYSICIAN: [facility]    ADMISSION DIAGNOSIS:  Hyperglycemia [R73.9] Stroke [facility]) [I63.9] Dysarthria [R47.1] Cerebrovascular accident (CVA), unspecified mechanism (HCC) [I63.9] CVA (cerebral vascular accident) (HCC) [I63.9]  DISCHARGE DIAGNOSIS:  Principal Problem:   Stroke-like episode Active Problems:   HTN (hypertension)   Type 2 diabetes mellitus with hyperlipidemia (HCC)   PAD (peripheral artery disease) (HCC)   History of BPH   History of alcohol abuse   Stroke [facility])   CVA (cerebral vascular accident) (HCC)   SECONDARY DIAGNOSIS:   Past Medical History:  Diagnosis Date  . High cholesterol   . Hypertension   . PVD (peripheral vascular disease) (HCC)     HOSPITAL COURSE:   1.  Acute left cerebral hemispheric infarcts in the MCA territory and border zones.  Patient already on Plavix and atorvastatin at home.  Patient seen by neurology and aspirin added for 21 days.  After 21 days continue Plavix alone.  LDL 112.  Echo with bubble study showed a normal ejection fraction, moderate left ventricular hypertrophy, no evidence of intra-atrial shunt with agitated saline contrast bubble study.  CT angio of the head and neck showed 55% distal left common carotid artery stenosis, moderate right vertebral artery stenosis, intracranial atherosclerosis involving left M1 and mild to moderate bilateral ICA stenosis. 2.  Essential hypertension continue amlodipine 3.  Type 2 diabetes mellitus with hyperlipidemia on atorvastatin.  Hemoglobin A1c 7.5.  Diet controlled for now.  Consider adding Glucophage as outpatient. 4.  Cocaine and cannabis seen in urine toxicology 5.  Alcohol abuse.  Continue thiamine and folic acid upon  discharge.  Patient quit drinking 2 weeks ago. 6.  PAD on Plavix and statin.   DISCHARGE CONDITIONS:   Satisfactory  CONSULTS OBTAINED:  Neurology  DRUG ALLERGIES:  No Known Allergies  DISCHARGE MEDICATIONS:   Allergies as of 06/21/2020   No Known Allergies     Medication List    STOP taking these medications   tamsulosin 0.4 MG Caps capsule Commonly known as: FLOMAX     TAKE these medications   amLODipine 10 MG tablet Commonly known as: NORVASC Take 1 tablet (10 mg total) by mouth daily.   aspirin 81 MG chewable tablet Chew 1 tablet (81 mg total) by mouth daily for 21 days.   atorvastatin 80 MG tablet Commonly known as: LIPITOR Take 1 tablet (80 mg total) by mouth daily.   clopidogrel 75 MG tablet Commonly known as: PLAVIX Take 1 tablet (75 mg total) by mouth daily.   folic acid 1 MG tablet Commonly known as: FOLVITE Take 1 tablet (1 mg total) by mouth daily.   LORazepam 0.5 MG tablet Commonly known as: Ativan Take 1 tablet (0.5 mg total) by mouth 2 (two) times daily as needed for anxiety.   melatonin 5 MG Tabs Take 1 tablet (5 mg total) by mouth at bedtime.   thiamine 100 MG tablet Take 1 tablet (100 mg total) by mouth daily.        DISCHARGE INSTRUCTIONS:   Follow-up PMD on Monday.  Keep appointment  If you experience worsening of your admission symptoms, develop shortness of breath, life threatening emergency, suicidal or homicidal thoughts you must seek medical attention immediately by calling 911  or calling your MD immediately  if symptoms less severe.  You Must read complete instructions/literature along with all the possible adverse reactions/side effects for all the Medicines you take and that have been prescribed to you. Take any new Medicines after you have completely understood and accept all the possible adverse reactions/side effects.   Please note  You were cared for by a hospitalist during your hospital stay. If you have any  questions about your discharge medications or the care you received while you were in the hospital after you are discharged, you can call the unit and asked to speak with the hospitalist on call if the hospitalist that took care of you is not available. Once you are discharged, your primary care physician will handle any further medical issues. Please note that NO REFILLS for any discharge medications will be authorized once you are discharged, as it is imperative that you return to your primary care physician (or establish a relationship with a primary care physician if you do not have one) for your aftercare needs so that they can reassess your need for medications and monitor your lab values.    Today   CHIEF COMPLAINT:   Chief Complaint  Patient presents with  . Altered Mental Status    HISTORY OF PRESENT ILLNESS:  Micheal Owens  is a 70 y.o. male came in with altered mental status and found to have a stroke   VITAL SIGNS:  Blood pressure 140/86, pulse 70, temperature 97.9 F (36.6 C), temperature source Oral, resp. rate 16, height 5\' 8"  (1.727 m), weight 84.8 kg, SpO2 100 %.  I/O:    Intake/Output Summary (Last 24 hours) at 06/21/2020 1659 Last data filed at 06/21/2020 1010 Gross per 24 hour  Intake 360 ml  Output 0 ml  Net 360 ml    PHYSICAL EXAMINATION:  GENERAL:  70 y.o.-year-old patient lying in the bed with no acute distress.  EYES: Pupils equal, round, reactive to light and accommodation. No scleral icterus. HEENT: Head atraumatic, normocephalic. Oropharynx and nasopharynx clear.   LUNGS: Normal breath sounds bilaterally, no wheezing, rales,rhonchi or crepitation. No use of accessory muscles of respiration.  CARDIOVASCULAR: S1, S2 normal. No murmurs, rubs, or gallops.  ABDOMEN: Soft, non-tender, non-distended.  EXTREMITIES: No pedal edema.  NEUROLOGIC: Cranial nerves II through XII are intact. Muscle strength 5/5 in all extremities. Sensation intact. Gait not checked.   PSYCHIATRIC: The patient is alert and oriented x 3.  SKIN: No obvious rash, lesion, or ulcer.   DATA REVIEW:   CBC Recent Labs  Lab 06/21/20 0714  WBC 3.8*  HGB 16.1  HCT 48.5  PLT 195    Chemistries  Recent Labs  Lab 06/18/20 1227 06/18/20 1514 06/19/20 0927 06/21/20 0714  NA 137  --    < > 139  K 3.7  --    < > 3.4*  CL 104  --    < > 105  CO2 24  --    < > 23  GLUCOSE 211*  --    < > 107*  BUN 19  --    < > 19  CREATININE 1.02  --    < > 1.00  CALCIUM 9.3  --    < > 9.1  MG  --  2.0  --   --   AST 24  --   --   --   ALT 20  --   --   --   ALKPHOS 52  --   --   --  BILITOT 0.8  --   --   --    < > = values in this interval not displayed.    Microbiology Results  Results for orders placed or performed during the hospital encounter of 06/18/20  Resp Panel by RT-PCR (Flu A&B, Covid) Nasopharyngeal Swab     Status: None   Collection Time: 06/18/20  1:27 PM   Specimen: Nasopharyngeal Swab; Nasopharyngeal(NP) swabs in vial transport medium  Result Value Ref Range Status   SARS Coronavirus 2 by RT PCR NEGATIVE NEGATIVE Final    Comment: (NOTE) SARS-CoV-2 target nucleic acids are NOT DETECTED.  The SARS-CoV-2 RNA is generally detectable in upper respiratory specimens during the acute phase of infection. The lowest concentration of SARS-CoV-2 viral copies this assay can detect is 138 copies/mL. A negative result does not preclude SARS-Cov-2 infection and should not be used as the sole basis for treatment or other patient management decisions. A negative result may occur with  improper specimen collection/handling, submission of specimen other than nasopharyngeal swab, presence of viral mutation(s) within the areas targeted by this assay, and inadequate number of viral copies(<138 copies/mL). A negative result must be combined with clinical observations, patient history, and epidemiological information. The expected result is Negative.  Fact Sheet for Patients:   BloggerCourse.comhttps://www.fda.gov/media/152166/download  Fact Sheet for Healthcare Providers:  SeriousBroker.ithttps://www.fda.gov/media/152162/download  This test is no t yet approved or cleared by the [place] FDA and  has been authorized for detection and/or diagnosis of SARS-CoV-2 by FDA under an Emergency Use Authorization (EUA). This EUA will remain  in effect (meaning this test can be used) for the duration of the COVID-19 declaration under Section 564(b)(1) of the Act, 21 U.S.C.section 360bbb-3(b)(1), unless the authorization is terminated  or revoked sooner.       Influenza A by PCR NEGATIVE NEGATIVE Final   Influenza B by PCR NEGATIVE NEGATIVE Final    Comment: (NOTE) The Xpert Xpress SARS-CoV-2/FLU/RSV plus assay is intended as an aid in the diagnosis of influenza from Nasopharyngeal swab specimens and should not be used as a sole basis for treatment. Nasal washings and aspirates are unacceptable for Xpert Xpress SARS-CoV-2/FLU/RSV testing.  Fact Sheet for Patients: BloggerCourse.comhttps://www.fda.gov/media/152166/download  Fact Sheet for Healthcare Providers: SeriousBroker.ithttps://www.fda.gov/media/152162/download  This test is not yet approved or cleared by the [place] FDA and has been authorized for detection and/or diagnosis of SARS-CoV-2 by FDA under an Emergency Use Authorization (EUA). This EUA will remain in effect (meaning this test can be used) for the duration of the COVID-19 declaration under Section 564(b)(1) of the Act, 21 U.S.C. section 360bbb-3(b)(1), unless the authorization is terminated or revoked.  Performed at [facility], [address] [place], [place] [address]      Management plans discussed with the patient, family and they are in agreement.  CODE STATUS:     Code Status Orders  (From admission, onward)         Start     Ordered   06/18/20 1359  Full code  Continuous        06/18/20 1404        Code Status History    This patient has a current code  status but no historical code status.   Advance Care Planning Activity      TOTAL TIME TAKING CARE OF THIS PATIENT: 32 minutes.    Alford Highlandichard Wieting M.D on 06/21/2020 at 4:59 PM  Between 7am to 6pm - Pager - [phone]  After 6pm go to [email or web address] -  password [email or web address]  [facility]  CC: Primary care physician; [facility]

## 2020-07-03 ENCOUNTER — Telehealth: Payer: Self-pay | Admitting: Cardiology

## 2020-07-03 NOTE — Telephone Encounter (Signed)
Patient spouse calling  States that patient had a monitor put on before leaving the hospital but it is coming off - would like to know what to do  Please call to discuss

## 2020-07-03 NOTE — Telephone Encounter (Signed)
Called and spoke with patients wife and she stated that when the patient got home from the hospital he waited 24 hours then showered.  She stated that the monitor did not stay on after the shower and she tried to reinforce it with tape.   I gave her the phone number to [employer] included on the back of the patient handout and instructed her to call it as they can send her out a new one.  Patients wife was grateful for the call back.

## 2020-07-14 ENCOUNTER — Other Ambulatory Visit: Payer: Self-pay

## 2020-07-14 ENCOUNTER — Ambulatory Visit (INDEPENDENT_AMBULATORY_CARE_PROVIDER_SITE_OTHER): Payer: Medicare Other | Admitting: Cardiology

## 2020-07-14 ENCOUNTER — Encounter: Payer: Self-pay | Admitting: Cardiology

## 2020-07-14 VITALS — BP 130/76 | HR 72 | Ht 68.0 in | Wt 186.0 lb

## 2020-07-14 DIAGNOSIS — E7800 Pure hypercholesterolemia, unspecified: Secondary | ICD-10-CM | POA: Diagnosis not present

## 2020-07-14 DIAGNOSIS — I10 Essential (primary) hypertension: Secondary | ICD-10-CM

## 2020-07-14 DIAGNOSIS — I639 Cerebral infarction, unspecified: Secondary | ICD-10-CM

## 2020-07-14 NOTE — Patient Instructions (Signed)

## 2020-07-14 NOTE — Progress Notes (Signed)
Cardiology Office Note:    Date:  07/14/2020   ID:  Micheal Owens, DOB 07-Jun-1950, MRN [id number]  PCP:  [facility], [facility]  Cardiologist:  Debbe Odea, MD  Advanced Practice Provider:  No care team member to display Electrophysiologist:  None       Referring MD: [facility]   Chief Complaint  Patient presents with  . New Patient (Initial Visit)    Hospital follow up - No complaints at this time. Meds reviewed verbally with patient.     History of Present Illness:    Micheal Owens is a 70 y.o. male with a hx of hypertension, hyperlipidemia, recent CVA, previous polysubstance abuse including cocaine, currently uses THC, who presents after being admitted for stroke.  Patient seen in the hospital 06/18/2020 presenting with right upper extremity weakness and confusion.    Work-up with MRI showed left cerebral hemispheric infarcts.  CT angio neck showed intracranial atherosclerosis including severe left M1 and mild to moderate bilateral ICA stenosis.  55 distal left common carotid artery stenosis.  Neurology consulted recommended aspirin and Plavix x3 weeks, continue with Plavix after.  Echocardiogram 06/19/2020 showed normal systolic function, impaired relaxation, moderate LVH.  Cardiac monitor was placed, patient will cardiac monitor for 12 days and then it fell off.  He mailed the monitor to device company.  Results are pending.  He feels okay, no concerns at this time apart from slight weakness in the right arm and walking a little slower..  Past Medical History:  Diagnosis Date  . High cholesterol   . Hypertension   . PVD (peripheral vascular disease) (HCC)     Past Surgical History:  Procedure Laterality Date  . FEMORAL ARTERY STENT      Current Medications: Current Meds  Medication Sig  . amLODipine (NORVASC) 10 MG tablet Take 1 tablet (10 mg total) by mouth daily.  Marland Kitchen atorvastatin (LIPITOR) 80 MG tablet Take 1 tablet (80 mg  total) by mouth daily.  . clopidogrel (PLAVIX) 75 MG tablet Take 1 tablet (75 mg total) by mouth daily.  . folic acid (FOLVITE) 1 MG tablet Take 1 tablet (1 mg total) by mouth daily.  Marland Kitchen LORazepam (ATIVAN) 0.5 MG tablet Take 1 tablet (0.5 mg total) by mouth 2 (two) times daily as needed for anxiety.  . melatonin 5 MG TABS Take 1 tablet (5 mg total) by mouth at bedtime.  . thiamine 100 MG tablet Take 1 tablet (100 mg total) by mouth daily.     Allergies:   Patient has no known allergies.   Social History   Socioeconomic History  . Marital status: Married    Spouse name: Not on file  . Number of children: Not on file  . Years of education: Not on file  . Highest education level: Not on file  Occupational History  . Not on file  Tobacco Use  . Smoking status: Former Games developer  . Smokeless tobacco: Never Used  Substance and Sexual Activity  . Alcohol use: Not Currently  . Drug use: Not Currently  . Sexual activity: Not on file  Other Topics Concern  . Not on file  Social History Narrative  . Not on file   Social Determinants of Health   Financial Resource Strain: Not on file  Food Insecurity: Not on file  Transportation Needs: Not on file  Physical Activity: Not on file  Stress: Not on file  Social Connections: Not on file  Family History: The patient's family history is not on file.  ROS:   Please see the history of present illness.     All other systems reviewed and are negative.  EKGs/Labs/Other Studies Reviewed:    The following studies were reviewed today:   EKG:  EKG is  ordered today.  The ekg ordered today demonstrates normal sinus rhythm  Recent Labs: 06/18/2020: ALT 20; Magnesium 2.0 06/21/2020: BUN 19; Creatinine, Ser 1.00; Hemoglobin 16.1; Platelets 195; Potassium 3.4; Sodium 139  Recent Lipid Panel    Component Value Date/Time   CHOL 158 06/19/2020 0609   TRIG 77 06/19/2020 0609   HDL 31 (L) 06/19/2020 0609   CHOLHDL 5.1 06/19/2020 0609   VLDL  15 06/19/2020 0609   LDLCALC 112 (H) 06/19/2020 0609     Risk Assessment/Calculations:      Physical Exam:    VS:  BP 130/76 (BP Location: Left Arm, Patient Position: Sitting, Cuff Size: Normal)   Pulse 72   Ht 5\' 8"  (1.727 m)   Wt 186 lb (84.4 kg)   SpO2 95%   BMI 28.28 kg/m     Wt Readings from Last 3 Encounters:  07/14/20 186 lb (84.4 kg)  06/18/20 187 lb (84.8 kg)     GEN:  Well nourished, well developed in no acute distress HEENT: Normal NECK: No JVD; No carotid bruits LYMPHATICS: No lymphadenopathy CARDIAC: RRR, no murmurs, rubs, gallops RESPIRATORY:  Clear to auscultation without rales, wheezing or rhonchi  ABDOMEN: Soft, non-tender, non-distended MUSCULOSKELETAL:  No edema; No deformity  SKIN: Warm and dry NEUROLOGIC:  Alert and oriented x 3 PSYCHIATRIC:  Normal affect   ASSESSMENT:    1. Cerebrovascular accident (CVA), unspecified mechanism (HCC)   2. Primary hypertension   3. Pure hypercholesterolemia    PLAN:    In order of problems listed above:  1. Recent CVA, carotid and intracranial atherosclerosis noted on CTA neck.  Likely cause for CVA including risk factors of hypertension, hyperlipidemia.  Results for cardiac monitor pending.  Patient will monitor for 12 days.  Will review for any atrial fibrillation or flutter when results come in.  Continue Plavix, Lipitor.  Echo with preserved EF. 2. Hypertension, BP controlled.  Continue amlodipine. 3. Hyperlipidemia, on Lipitor.  Follow-up in 6 months.  Will call patient after cardiac monitor results received.     Medication Adjustments/Labs and Tests Ordered: Current medicines are reviewed at length with the patient today.  Concerns regarding medicines are outlined above.  Orders Placed This Encounter  Procedures  . EKG 12-Lead   No orders of the defined types were placed in this encounter.   Patient Instructions  Medication Instructions:  Your physician recommends that you continue on your  current medications as directed. Please refer to the Current Medication list given to you today.  *If you need a refill on your cardiac medications before your next appointment, please call your pharmacy*   Lab Work: None ordered If you have labs (blood work) drawn today and your tests are completely normal, you will receive your results only by: 06/20/20 MyChart Message (if you have MyChart) OR . A paper copy in the mail If you have any lab test that is abnormal or we need to change your treatment, we will call you to review the results.   Testing/Procedures: None ordered   Follow-Up: At [facility], you and your health needs are our priority.  As part of our continuing mission to provide you with exceptional heart care, we  have created designated Provider Care Teams.  These Care Teams include your primary Cardiologist (physician) and Advanced Practice Providers (APPs -  Physician Assistants and Nurse Practitioners) who all work together to provide you with the care you need, when you need it.  We recommend signing up for the patient portal called "MyChart".  Sign up information is provided on this After Visit Summary.  MyChart is used to connect with patients for Virtual Visits (Telemedicine).  Patients are able to view lab/test results, encounter notes, upcoming appointments, etc.  Non-urgent messages can be sent to your provider as well.   To learn more about what you can do with MyChart, go to [email or web address].    Your next appointment:   6 month(s)  The format for your next appointment:   In Person  Provider:   Debbe Odea, MD   Other Instructions     Signed, Debbe Odea, MD  07/14/2020 12:31 PM    [facility]

## 2020-07-18 NOTE — Addendum Note (Signed)
Encounter addended by: Bryna Colander, RN on: 07/18/2020 1:31 PM  Actions taken: Imaging Exam ended

## 2020-07-18 NOTE — Addendum Note (Signed)
Encounter addended by: Arvid Marengo S, RN on: 07/18/2020 1:31 PM  Actions taken: Imaging Exam ended

## 2020-07-20 ENCOUNTER — Telehealth: Payer: Self-pay | Admitting: *Deleted

## 2020-07-20 NOTE — Telephone Encounter (Signed)
-----   Message from Creig Hines, NP sent at 07/19/2020  5:40 PM EDT ----- Monitor placed to assess for afib in the setting of stroke like symptoms leading to prior hospitalization.  No afib/flutter noted.  Occasional fast heart rates stemming from the top chambers were noted, though these were generally brief (max 18 beats), and did not elicit a trigger, thus were likely asymptomatic. This does not require a specific treatment though if he has been bothered by palpitations, he can either f/u with his primary cardiologist, or if he does not have one, can be seen as a new pt with our practice.

## 2020-07-20 NOTE — Telephone Encounter (Signed)
Spoke with patients wife and advised I was calling to provide results to patient. She is not listed on release form so requested that she have him call back so we can review them. Line disconnected.

## 2020-07-20 NOTE — Telephone Encounter (Signed)
Patient calling back stating he is going out of town and he is giving verbal permission for office to speak with his wife, Chatham Howington. Please advise

## 2020-07-20 NOTE — Telephone Encounter (Signed)
Called and spoke with patients wife per his verbal permission and reviewed results and recommendations with her. She verbalized understanding with no further questions at this time.

## 2020-08-09 ENCOUNTER — Telehealth: Payer: Self-pay | Admitting: Cardiology

## 2020-08-09 NOTE — Telephone Encounter (Signed)
   Name: Micheal Owens  DOB: October 31, 1950  MRN: [id number]   Primary Cardiologist: Debbe Odea, MD  Chart reviewed as part of pre-operative protocol coverage. Patient was contacted 08/09/2020 in reference to pre-operative risk assessment for pending surgery as outlined below.  Partick Musselman was last seen on 07/14/20 by Dr. Azucena Cecil.  Since that day, Cade Dashner has done well.  He can complete 4.0 METS without angina. He is on plavix for recent CVA. Please reach out his neurology team or PCP for plavix hold.  Therefore, based on ACC/AHA guidelines, the patient would be at acceptable risk for the planned procedure without further cardiovascular testing.   The patient was advised that if he develops new symptoms prior to surgery to contact our office to arrange for a follow-up visit, and he verbalized understanding.  I will route this recommendation to the requesting party via Epic fax function and remove from pre-op pool. Please call with questions.  Marcelino Duster, PA 08/09/2020, 5:32 PM

## 2020-08-09 NOTE — Telephone Encounter (Signed)
   [facility] Pre-operative Risk Assessment    Patient Name: Micheal Owens  DOB: [birth year]  MRN: [id number]   HEARTCARE STAFF: - Please ensure there is not already an duplicate clearance open for this procedure. - Under Visit Info/Reason for Call, type in Other and utilize the format Clearance MM/DD/YY or Clearance TBD. Do not use dashes or single digits. - If request is for dental extraction, please clarify the # of teeth to be extracted.  Request for surgical clearance:  1. What type of surgery is being performed? 16 dental extractions with alveoloplasty, removal maxillary exostosis and torus  2. When is this surgery scheduled? TBD  3. What type of clearance is required (medical clearance vs. Pharmacy clearance to hold med vs. Both)? both  4. Are there any medications that need to be held prior to surgery and how long? Plavix instructions   5. Practice name and name of physician performing surgery? Micheal Owens, DMD  6. What is the office phone number? [phone]   7.   What is the office fax number? [phone]  8.   Anesthesia type (None, local, MAC, general) ? General    Micheal Owens 08/09/2020, 2:43 PM  _________________________________________________________________   (provider comments below)

## 2021-03-16 ENCOUNTER — Other Ambulatory Visit: Payer: Self-pay

## 2021-03-16 ENCOUNTER — Ambulatory Visit (INDEPENDENT_AMBULATORY_CARE_PROVIDER_SITE_OTHER): Payer: Medicare Other | Admitting: Cardiology

## 2021-03-16 ENCOUNTER — Telehealth: Payer: Self-pay | Admitting: Cardiology

## 2021-03-16 ENCOUNTER — Encounter: Payer: Self-pay | Admitting: Cardiology

## 2021-03-16 VITALS — BP 130/84 | HR 66 | Ht 68.0 in | Wt 163.0 lb

## 2021-03-16 DIAGNOSIS — E7800 Pure hypercholesterolemia, unspecified: Secondary | ICD-10-CM

## 2021-03-16 DIAGNOSIS — I10 Essential (primary) hypertension: Secondary | ICD-10-CM

## 2021-03-16 DIAGNOSIS — I639 Cerebral infarction, unspecified: Secondary | ICD-10-CM

## 2021-03-16 NOTE — Telephone Encounter (Signed)
Spoke to pt's wife with pt at side on speaker phone, pt approves to speak with wife.  All questions were answered. No further needs at this time.

## 2021-03-16 NOTE — Progress Notes (Signed)
Cardiology Office Note:    Date:  03/16/2021   ID:  Micheal Owens, DOB June 29, 1950, MRN [id number]  PCP:  [facility]  Cardiologist:  Debbe Odea, MD  Advanced Practice Provider:  No care team member to display Electrophysiologist:  None       Referring MD: [facility]   Chief Complaint  Patient presents with   Follow-up    6 months    History of Present Illness:    Micheal Owens is a 70 y.o. male with a hx of hypertension, hyperlipidemia, CVA, previous polysubstance abuse including cocaine, currently uses THC, who presents for follow-up.  Previously seen as follow-up after being admitted for stroke.     Patient seen in the hospital 06/2020 presenting with right upper extremity weakness and confusion.  Diagnosed with breath cerebral infarcts.  Cardiac monitor was placed to evaluate any significant arrhythmias such as A. fib or flutter contributing to strokes.  States weakness in his arms is much improved, able to ambulate without any help.  Endorsed taking Plavix, Lipitor, amlodipine as prescribed.  Prior notes Echocardiogram 06/19/2020 showed normal systolic function, impaired relaxation, moderate LVH.  Work-up with MRI 06/2020 showed left cerebral hemispheric infarcts.  CT angio neck showed intracranial atherosclerosis including severe left M1 and mild to moderate bilateral ICA stenosis.   Neurology consulted recommended aspirin and Plavix x3 weeks, continue with Plavix after.    He feels okay, no concerns at this time apart from slight weakness in the right arm and walking a little slower..  Past Medical History:  Diagnosis Date   High cholesterol    Hypertension    PVD (peripheral vascular disease) (HCC)     Past Surgical History:  Procedure Laterality Date   FEMORAL ARTERY STENT      Current Medications: Current Meds  Medication Sig   amLODipine (NORVASC) 10 MG tablet Take 1 tablet (10 mg total) by mouth daily.    atorvastatin (LIPITOR) 80 MG tablet Take 1 tablet (80 mg total) by mouth daily.   clopidogrel (PLAVIX) 75 MG tablet Take 1 tablet (75 mg total) by mouth daily.   folic acid (FOLVITE) 1 MG tablet Take 1 tablet (1 mg total) by mouth daily.   LORazepam (ATIVAN) 0.5 MG tablet Take 1 tablet (0.5 mg total) by mouth 2 (two) times daily as needed for anxiety.   melatonin 5 MG TABS Take 1 tablet (5 mg total) by mouth at bedtime.   thiamine 100 MG tablet Take 1 tablet (100 mg total) by mouth daily.     Allergies:   Patient has no known allergies.   Social History   Socioeconomic History   Marital status: Married    Spouse name: Not on file   Number of children: Not on file   Years of education: Not on file   Highest education level: Not on file  Occupational History   Not on file  Tobacco Use   Smoking status: Former   Smokeless tobacco: Never  Substance and Sexual Activity   Alcohol use: Not Currently   Drug use: Not Currently   Sexual activity: Not on file  Other Topics Concern   Not on file  Social History Narrative   Not on file   Social Determinants of Health   Financial Resource Strain: Not on file  Food Insecurity: Not on file  Transportation Needs: Not on file  Physical Activity: Not on file  Stress: Not on file  Social Connections: Not  on file     Family History: The patient's family history is not on file.  ROS:   Please see the history of present illness.     All other systems reviewed and are negative.  EKGs/Labs/Other Studies Reviewed:    The following studies were reviewed today:   EKG:  EKG is  ordered today.  The ekg ordered today demonstrates normal sinus rhythm  Recent Labs: 06/18/2020: ALT 20; Magnesium 2.0 06/21/2020: BUN 19; Creatinine, Ser 1.00; Hemoglobin 16.1; Platelets 195; Potassium 3.4; Sodium 139  Recent Lipid Panel    Component Value Date/Time   CHOL 158 06/19/2020 0609   TRIG 77 06/19/2020 0609   HDL 31 (L) 06/19/2020 0609   CHOLHDL  5.1 06/19/2020 0609   VLDL 15 06/19/2020 0609   LDLCALC 112 (H) 06/19/2020 0609     Risk Assessment/Calculations:      Physical Exam:    VS:  BP 130/84   Pulse 66   Ht 5\' 8"  (1.727 m)   Wt 163 lb (73.9 kg)   SpO2 98%   BMI 24.78 kg/m     Wt Readings from Last 3 Encounters:  03/16/21 163 lb (73.9 kg)  07/14/20 186 lb (84.4 kg)  06/18/20 187 lb (84.8 kg)     GEN:  Well nourished, well developed in no acute distress HEENT: Normal NECK: No JVD; No carotid bruits LYMPHATICS: No lymphadenopathy CARDIAC: RRR, no murmurs, rubs, gallops RESPIRATORY:  Clear to auscultation without rales, wheezing or rhonchi  ABDOMEN: Soft, non-tender, non-distended MUSCULOSKELETAL:  No edema; No deformity  SKIN: Warm and dry NEUROLOGIC:  Alert and oriented x 3 PSYCHIATRIC:  Normal affect   ASSESSMENT:    1. Cerebrovascular accident (CVA), unspecified mechanism (HCC)   2. Primary hypertension   3. Pure hypercholesterolemia     PLAN:    In order of problems listed above:  Recent CVA, carotid and intracranial atherosclerosis noted on CTA neck.  Likely cause for CVA including risk factors of hypertension, hyperlipidemia.  Cardiac monitor with no A. fib or atrial flutter.  Continue Plavix, Lipitor.  Echo with preserved EF.  Keep appointments with neurology Hypertension, BP controlled.  Continue amlodipine. Hyperlipidemia, on Lipitor 80mg .  Follow-up as needed     Medication Adjustments/Labs and Tests Ordered: Current medicines are reviewed at length with the patient today.  Concerns regarding medicines are outlined above.  Orders Placed This Encounter  Procedures   EKG 12-Lead    No orders of the defined types were placed in this encounter.   Patient Instructions  Medication Instructions:  Your physician recommends that you continue on your current medications as directed. Please refer to the Current Medication list given to you today.  *If you need a refill on your cardiac  medications before your next appointment, please call your pharmacy*   Lab Work: None ordered If you have labs (blood work) drawn today and your tests are completely normal, you will receive your results only by: MyChart Message (if you have MyChart) OR A paper copy in the mail If you have any lab test that is abnormal or we need to change your treatment, we will call you to review the results.   Testing/Procedures: None ordered   Follow-Up: At [facility], you and your health needs are our priority.  As part of our continuing mission to provide you with exceptional heart care, we have created designated Provider Care Teams.  These Care Teams include your primary Cardiologist (physician) and Advanced Practice Providers (APPs -  Physician Assistants and Nurse Practitioners) who all work together to provide you with the care you need, when you need it.  We recommend signing up for the patient portal called "MyChart".  Sign up information is provided on this After Visit Summary.  MyChart is used to connect with patients for Virtual Visits (Telemedicine).  Patients are able to view lab/test results, encounter notes, upcoming appointments, etc.  Non-urgent messages can be sent to your provider as well.   To learn more about what you can do with MyChart, go to [email or web address].    Your next appointment:   Follow up as needed   The format for your next appointment:   In Person  Provider:   You may see Debbe Odea, MD or one of the following Advanced Practice Providers on your designated Care Team:   Nicolasa Ducking, NP Eula Listen, PA-C Cadence Fransico Michael, [place]    Other Instructions    Signed, Debbe Odea, MD  03/16/2021 12:39 PM    [facility]

## 2021-03-16 NOTE — Patient Instructions (Signed)

## 2021-03-16 NOTE — Telephone Encounter (Signed)
Please call wife to discuss visit today. He is confused about what he was diagnosed with today.

## 2021-03-21 ENCOUNTER — Other Ambulatory Visit: Payer: Medicare Other

## 2021-05-04 ENCOUNTER — Ambulatory Visit: Payer: Medicare Other | Admitting: Cardiology

## 2021-06-14 NOTE — H&P (Addendum)
Patient: Micheal Owens  PID: [id number]  DOB: Jul 06, 1950  SEX: Male  ? ?Patient referred by Nat Math , DDS for extraction all remaining teeth ? ?CC: Bad teeth ? ?HPI: Previous consult 08/09/2020. Medical clearance not given due to recent stroke. MD advised waiting 6 months.  ? ?Past Medical History:  High Blood Pressure, Diabetes, Stroke, Drug Abuse/Alcohol Abuse, Peripheral Vascular Disease, Hypercholesterolemia   ? ?Medications: Plavix, Amlodipine, Melatonin, Metformin, Atorvastatin, Prevagen, Vitamin D, Ativan   ? ?Allergies:     NKDA   ? ?Surgeries:   Femoral stents    ? ?Social History       ?Smoking: denies           ?Alcohol: 1/2 pint liquor/day ?Drug use:  Cocaine 2        ?                 ?Exam: BMI 28. Gross decay all remaining teeth # 7, 9, 11, 19, 20, 21, 22, 23, 24, 25, 27, 28, 29, 30, 31, 32, Maxillary hyperplastic tuberosity right, palatal torus maxilla.   No purulence, edema, fluctuance, trismus. Oral cancer screening negative. Pharynx clear. No lymphadenopathy. ?Cor: RRR  ?Lungs: Clear ? ?Panorex: Severe periodontal disease and caries.  ? ?Assessment: ASA 3. Non-restorable  teeth # 7, 9, 11, 19, 20, 21, 22, 23, 24, 25, 27, 28, 29, 30, 31, 32, Maxillary hyperplastic tuberosity right, palatal torus maxilla.            ? ?Plan: 1. MD Clearance ? 2. Extraction Teeth # teeth # 7, 9, 11, 19, 20, 21, 22, 3, 24, 25, 27, 28, 29, 30, 31, 32, Reduction Maxillary hyperplastic tuberosity right, removal palatal torus lateral maxilla.  Hospital Day surgery.                ?  ?Rx: none              ? ?Risks and complications explained. Questions answered.  ? ?Georgia Lopes, DMD ?

## 2021-06-20 ENCOUNTER — Encounter (HOSPITAL_COMMUNITY): Payer: Self-pay | Admitting: Oral Surgery

## 2021-06-20 ENCOUNTER — Other Ambulatory Visit: Payer: Self-pay

## 2021-06-20 NOTE — Progress Notes (Signed)
PCP - Dr. Oralia Manis- [facility] ? ?Cardiologist - Dr. Azucena Cecil, B. ? ?EP- Denies ? ?Endocrine- Denies ? ?Pulm- Denies ? ?Chest x-ray - Denies ? ?EKG - 03/16/21 (E) ? ?Stress Test - Denies ? ?ECHO - 06/19/20 (E) ? ?Cardiac Cath - Denies ? ?AICD-na ?PM-na ?LOOP-na ? ?Nerve Stimulator- Denies ? ?Dialysis- Denies ? ?Sleep Study - Denies ?CPAP - Denies ? ?LABS- 06/22/21: CBC, CMP ? ?ASA- LD- 3/10 ?Plavix- LD- 3/10 ? ?ERAS- No ? ?HA1C- 01/19/21(CE): 5.8 ?Fasting Blood Sugar - 109-124 ?Checks Blood Sugar ___1__ times a day ? ?Anesthesia- Yes- cardiac history, pre-op clearance (CE) 05/02/21 ? ?Pt denies having chest pain, sob, or fever during the pre-op phone call. All instructions explained to the pt, with a verbal understanding of the material including: as of today,  stop taking all Aspirin (unless instructed by your doctor) and Other Aspirin containing products, Vitamins, Fish oils, and Herbal medications. Also stop all NSAIDS i.e. Advil, Ibuprofen, Motrin, Aleve, Anaprox, Naproxen, BC, Goody Powders, and all Supplements. ? ?WHAT DO I DO ABOUT MY DIABETES MEDICATION? ? ?Do not take Metformin the morning of surgery. ? ?If your CBG is greater than 220 mg/dL, inform the staff upon arrival to Short Stay. ? ?How do I manage my blood sugar before surgery? ?Check your blood sugar at least 4 times a day, starting 2 days before surgery, to make sure that the level is not too high or low. ?Check your blood sugar the morning of your surgery when you wake up and every 2 hours until you get to the Short Stay unit. ?If your blood sugar is less than 70 mg/dL, you will need to treat for low blood sugar: ?Do not take insulin. ?Treat a low blood sugar (less than 70 mg/dL) with ? cup of clear juice (cranberry or apple), 4 glucose tablets, OR glucose gel. ?Recheck blood sugar in 15 minutes after treatment (to make sure it is greater than 70 mg/dL). If your blood sugar is not greater than 70 mg/dL on recheck, call [phone] ? for further  instructions. ?Report your blood sugar to the short stay nurse when you get to Short Stay. ? ?Reviewed and Endorsed by [facility] Patient Education Committee, August 2015 ? ? Pt also instructed to wear a mask and social distance if he goes out. The opportunity to ask questions was provided.  ? ? ?Coronavirus Screening ? ?Have you experienced the following symptoms:  ?Cough yes/no: No ?Fever (>100.70F)  yes/no: No ?Runny nose yes/no: No ?Sore throat yes/no: No ?Difficulty breathing/shortness of breath  yes/no: No ? ?Have you or a family member traveled in the last 14 days and where? yes/no: No ? ? ?If the patient indicates "YES" to the above questions, their PAT will be rescheduled to limit the exposure to others and, the surgeon will be notified. THE PATIENT WILL NEED TO BE ASYMPTOMATIC FOR 14 DAYS.  ? ?If the patient is not experiencing any of these symptoms, the PAT nurse will instruct them to NOT bring anyone with them to their appointment since they may have these symptoms or traveled as well.  ? ?Please remind your patients and families that hospital visitation restrictions are in effect and the importance of the restrictions.  ? ?

## 2021-06-21 NOTE — Anesthesia Preprocedure Evaluation (Addendum)
Anesthesia Evaluation  ?Patient identified by MRN, date of birth, ID band ?Patient awake ? ? ? ?Reviewed: ?Allergy & Precautions, NPO status , Patient's Chart, lab work & pertinent test results ? ?Airway ?Mallampati: II ? ?TM Distance: >3 FB ?Neck ROM: Full ? ? ? Dental ? ?(+) Poor Dentition ?  ?Pulmonary ?neg pulmonary ROS, former smoker,  ?  ?Pulmonary exam normal ?breath sounds clear to auscultation ? ? ? ? ? ? Cardiovascular ?hypertension, Pt. on medications ?+ Peripheral Vascular Disease  ?Normal cardiovascular exam ?Rhythm:Regular Rate:Normal ? ? ?  ?Neuro/Psych ?CVA negative psych ROS  ? GI/Hepatic ?negative GI ROS, Neg liver ROS,   ?Endo/Other  ?negative endocrine ROSdiabetes, Type 2 ? Renal/GU ?negative Renal ROS  ?negative genitourinary ?  ?Musculoskeletal ?negative musculoskeletal ROS ?(+)  ? Abdominal ?  ?Peds ?negative pediatric ROS ?(+)  Hematology ?negative hematology ROS ?(+)   ?Anesthesia Other Findings ? ? Reproductive/Obstetrics ?negative OB ROS ? ?  ? ? ? ? ? ? ? ? ? ? ? ? ? ?  ?  ? ? ? ? ? ? ? ?Anesthesia Physical ?Anesthesia Plan ? ?ASA: 3 ? ?Anesthesia Plan: General  ? ?Post-op Pain Management: Dilaudid IV and Minimal or no pain anticipated  ? ?Induction: Intravenous ? ?PONV Risk Score and Plan: 2 and Ondansetron, Midazolam and Treatment may vary due to age or medical condition ? ?Airway Management Planned: Nasal ETT ? ?Additional Equipment:  ? ?Intra-op Plan:  ? ?Post-operative Plan: Extubation in OR ? ?Informed Consent: I have reviewed the patients History and Physical, chart, labs and discussed the procedure including the risks, benefits and alternatives for the proposed anesthesia with the patient or authorized representative who has indicated his/her understanding and acceptance.  ? ? ? ?Dental advisory given ? ?Plan Discussed with: CRNA ? ?Anesthesia Plan Comments: (See APP note by Joslyn Hy, FNP )  ? ? ? ? ? ?Anesthesia Quick Evaluation ? ?

## 2021-06-21 NOTE — Progress Notes (Signed)
Anesthesia Chart Review: ? ?Pt is a same day work up ? ? Case: [id number] Date/Time: 06/22/21 0830  ? Procedure: DENTAL RESTORATION/EXTRACTIONS  ? Anesthesia type: General  ? Pre-op diagnosis: DENTAL CARIES  ? Location: [facility] ROOM 04 / [facility]  ? Surgeons: Diona Browner, DMD  ? ?  ? ? ?DISCUSSION: ?Pt is 71 years old with hx HTN, DM, CVA (06/2020), alcohol use disorder (currently drinking about half a pint of whiskey per day, is also on naltrexone), hx drug use (cocaine) ? ?Pt to hold plavix 5 days before surgery ? ? ?PROVIDERS: ?- PCP is Gwendalyn Ege, MD who noted pt is optimized for surgery at last office visit 05/02/21 aside from active alcohol use (notes in care everywhere)  ?- Used to see cardiologist Kate Sable, MD. Last office visit 03/16/21, prn f/u recommended.  ? ? ?LABS: Will be obtained day of surgery  ? ? ?EKG 03/16/21 (at Dr. Thereasa Solo office): NSR with sinus arrhythmia. T wave abnormality, consider anterolateral ischemia.  ? ? ?CV: ?Cardiac monitor 07/18/20:  ?- No evidence for atrial fibrillation or atrial flutter noted ? ?Echo 06/19/20:  ?1. Left ventricular ejection fraction, by estimation, is 60 to 65%. The left ventricle has normal function. The left ventricle has no regional wall motion abnormalities. There is moderate left ventricular hypertrophy. Left ventricular diastolic  ?parameters are consistent with Grade I diastolic dysfunction (impaired relaxation).  ? 2. Right ventricular systolic function is normal. The right ventricular  ?size is normal. Tricuspid regurgitation signal is inadequate for assessing PA pressure.  ? 3. Left atrial size was mildly dilated.  ? 4. Right atrial size was mildly dilated.  ? 5. The mitral valve is normal in structure. No evidence of mitral valve regurgitation. No evidence of mitral stenosis.  ? 6. The aortic valve is normal in structure. Aortic valve regurgitation is not visualized. Mild to moderate aortic valve sclerosis/calcification is present, without any  evidence of aortic stenosis.  ? 7. The inferior vena cava is normal in size with greater than 50%  ?respiratory variability, suggesting right atrial pressure of 3 mmHg.  ? 8. Agitated saline contrast bubble study was negative, with no evidence of any interatrial shunt.  ? ? ?Past Medical History:  ?Diagnosis Date  ? Diabetes mellitus without complication ([place])   ? Type II  ? High cholesterol   ? Hypertension   ? PVD (peripheral vascular disease) ([place])   ? Stroke [facility])   ? Substance abuse ([place])   ? ? ?Past Surgical History:  ?Procedure Laterality Date  ? FEMORAL ARTERY STENT    ? ? ?MEDICATIONS: ?No current facility-administered medications for this encounter.  ? ? acetaminophen (TYLENOL) 500 MG tablet  ? amLODipine (NORVASC) 10 MG tablet  ? aspirin EC 81 MG tablet  ? atorvastatin (LIPITOR) 80 MG tablet  ? Cholecalciferol (VITAMIN D) 50 MCG (2000 UT) tablet  ? clopidogrel (PLAVIX) 75 MG tablet  ? diphenhydramine-acetaminophen (TYLENOL PM) 25-500 MG TABS tablet  ? metFORMIN (GLUCOPHAGE) 500 MG tablet  ? naltrexone (DEPADE) 50 MG tablet  ? tamsulosin (FLOMAX) 0.4 MG CAPS capsule  ? ?- Pt to hold plavix 5 days before surgery ? ?If labs acceptable day of surgery, I anticipate pt can proceed with surgery as scheduled. ? ?Willeen Cass, PhD, FNP-BC ?[facility]/Anesthesiology ?Phone: [phone] ?06/21/2021 10:58 AM ? ? ? ? ? ?

## 2021-06-22 ENCOUNTER — Ambulatory Visit (HOSPITAL_BASED_OUTPATIENT_CLINIC_OR_DEPARTMENT_OTHER): Payer: Medicare Other | Admitting: Emergency Medicine

## 2021-06-22 ENCOUNTER — Encounter (HOSPITAL_COMMUNITY): Payer: Self-pay | Admitting: Oral Surgery

## 2021-06-22 ENCOUNTER — Ambulatory Visit (HOSPITAL_COMMUNITY): Payer: Medicare Other | Admitting: Emergency Medicine

## 2021-06-22 ENCOUNTER — Encounter (HOSPITAL_COMMUNITY)
Admission: RE | Disposition: A | Discharge status: Home / Self Care | Payer: Self-pay | Source: Ambulatory Visit | Attending: Oral Surgery

## 2021-06-22 ENCOUNTER — Ambulatory Visit (HOSPITAL_COMMUNITY)
Admission: RE | Admit: 2021-06-22 | Discharge: 2021-06-22 | Disposition: A | Discharge status: Home / Self Care | Payer: Medicare Other | Source: Ambulatory Visit | Attending: Oral Surgery | Admitting: Oral Surgery

## 2021-06-22 ENCOUNTER — Other Ambulatory Visit: Payer: Self-pay

## 2021-06-22 DIAGNOSIS — M2601 Maxillary hyperplasia: Secondary | ICD-10-CM | POA: Insufficient documentation

## 2021-06-22 DIAGNOSIS — M270 Developmental disorders of jaws: Secondary | ICD-10-CM

## 2021-06-22 DIAGNOSIS — Z8673 Personal history of transient ischemic attack (TIA), and cerebral infarction without residual deficits: Secondary | ICD-10-CM | POA: Insufficient documentation

## 2021-06-22 DIAGNOSIS — M2607 Excessive tuberosity of jaw: Secondary | ICD-10-CM

## 2021-06-22 DIAGNOSIS — M2679 Other specified alveolar anomalies: Secondary | ICD-10-CM | POA: Insufficient documentation

## 2021-06-22 DIAGNOSIS — K029 Dental caries, unspecified: Secondary | ICD-10-CM

## 2021-06-22 DIAGNOSIS — E1151 Type 2 diabetes mellitus with diabetic peripheral angiopathy without gangrene: Secondary | ICD-10-CM | POA: Diagnosis not present

## 2021-06-22 DIAGNOSIS — K0530 Chronic periodontitis, unspecified: Secondary | ICD-10-CM | POA: Diagnosis not present

## 2021-06-22 DIAGNOSIS — Z87891 Personal history of nicotine dependence: Secondary | ICD-10-CM | POA: Insufficient documentation

## 2021-06-22 DIAGNOSIS — K0889 Other specified disorders of teeth and supporting structures: Secondary | ICD-10-CM | POA: Diagnosis not present

## 2021-06-22 DIAGNOSIS — I10 Essential (primary) hypertension: Secondary | ICD-10-CM | POA: Diagnosis not present

## 2021-06-22 HISTORY — PX: TOOTH EXTRACTION: SHX859

## 2021-06-22 HISTORY — DX: Type 2 diabetes mellitus without complications: E11.9

## 2021-06-22 HISTORY — DX: Other psychoactive substance abuse, uncomplicated: F19.10

## 2021-06-22 HISTORY — DX: Cerebral infarction, unspecified: I63.9

## 2021-06-22 LAB — BASIC METABOLIC PANEL
Anion gap: 8 (ref 5–15)
BUN: 13 mg/dL (ref 8–23)
CO2: 24 mmol/L (ref 22–32)
Calcium: 8.9 mg/dL (ref 8.9–10.3)
Chloride: 106 mmol/L (ref 98–111)
Creatinine, Ser: 0.67 mg/dL (ref 0.61–1.24)
GFR, Estimated: >60 mL/min (ref >60)
Glucose, Bld: 115 mg/dL — ABNORMAL HIGH (ref 70–99)
Potassium: 3.9 mmol/L (ref 3.5–5.1)
Sodium: 138 mmol/L (ref 135–145)

## 2021-06-22 LAB — CBC
HCT: 39.3 % (ref 39.0–52.0)
Hemoglobin: 12.8 g/dL — ABNORMAL LOW (ref 13.0–17.0)
MCH: 29.8 pg (ref 26.0–34.0)
MCHC: 32.6 g/dL (ref 30.0–36.0)
MCV: 91.6 fL (ref 80.0–100.0)
Platelets: 190 10*3/uL (ref 150–400)
RBC: 4.29 MIL/uL (ref 4.22–5.81)
RDW: 13.3 % (ref 11.5–15.5)
WBC: 3.8 10*3/uL — ABNORMAL LOW (ref 4.0–10.5)
nRBC: 0 % (ref 0.0–0.2)

## 2021-06-22 LAB — GLUCOSE, CAPILLARY
Glucose-Capillary: 140 mg/dL — ABNORMAL HIGH (ref 70–99)
Glucose-Capillary: 193 mg/dL — ABNORMAL HIGH (ref 70–99)

## 2021-06-22 SURGERY — DENTAL RESTORATION/EXTRACTIONS
Anesthesia: General | Site: Mouth

## 2021-06-22 MED ORDER — CHLORHEXIDINE GLUCONATE 0.12 % MT SOLN: 15.0000 mL | Freq: Once | OROMUCOSAL | Status: AC

## 2021-06-22 MED ORDER — DEXAMETHASONE SODIUM PHOSPHATE 10 MG/ML IJ SOLN
INTRAMUSCULAR | Status: AC
  Filled 2021-06-22: qty 1

## 2021-06-22 MED ORDER — MIDAZOLAM HCL 2 MG/2ML IJ SOLN
INTRAMUSCULAR | Status: AC
  Filled 2021-06-22: qty 2

## 2021-06-22 MED ORDER — OXYCODONE-ACETAMINOPHEN 5-325 MG PO TABS: 1.0000 | ORAL_TABLET | ORAL | 0 refills | Status: AC | PRN

## 2021-06-22 MED ORDER — ESMOLOL HCL 100 MG/10ML IV SOLN
INTRAVENOUS | Status: AC
  Filled 2021-06-22: qty 10

## 2021-06-22 MED ORDER — OXYCODONE HCL 5 MG/5ML PO SOLN: 5.0000 mg | Freq: Once | ORAL | Status: DC | PRN

## 2021-06-22 MED ORDER — ORAL CARE MOUTH RINSE: 15.0000 mL | Freq: Once | OROMUCOSAL | Status: AC

## 2021-06-22 MED ORDER — SUGAMMADEX SODIUM 200 MG/2ML IV SOLN
INTRAVENOUS | Status: DC | PRN
  Administered 2021-06-22: 200 mg via INTRAVENOUS

## 2021-06-22 MED ORDER — AMISULPRIDE (ANTIEMETIC) 5 MG/2ML IV SOLN: 10.0000 mg | Freq: Once | INTRAVENOUS | Status: DC | PRN

## 2021-06-22 MED ORDER — CEFAZOLIN SODIUM-DEXTROSE 2-4 GM/100ML-% IV SOLN
2.0000 g | INTRAVENOUS | Status: AC
  Administered 2021-06-22: 2 g via INTRAVENOUS

## 2021-06-22 MED ORDER — 0.9 % SODIUM CHLORIDE (POUR BTL) OPTIME
TOPICAL | Status: DC | PRN
  Administered 2021-06-22: 1000 mL

## 2021-06-22 MED ORDER — CEFAZOLIN SODIUM-DEXTROSE 2-4 GM/100ML-% IV SOLN
INTRAVENOUS | Status: AC
  Filled 2021-06-22: qty 100

## 2021-06-22 MED ORDER — PROMETHAZINE HCL 25 MG/ML IJ SOLN: 6.2500 mg | INTRAMUSCULAR | Status: DC | PRN

## 2021-06-22 MED ORDER — MIDAZOLAM HCL 2 MG/2ML IJ SOLN
INTRAMUSCULAR | Status: DC | PRN
  Administered 2021-06-22: 2 mg via INTRAVENOUS

## 2021-06-22 MED ORDER — SODIUM CHLORIDE 0.9 % IR SOLN
Status: DC | PRN
  Administered 2021-06-22: 1000 mL

## 2021-06-22 MED ORDER — ONDANSETRON HCL 4 MG/2ML IJ SOLN
INTRAMUSCULAR | Status: AC
  Filled 2021-06-22: qty 2

## 2021-06-22 MED ORDER — HYDROMORPHONE HCL 1 MG/ML IJ SOLN: 0.2500 mg | INTRAMUSCULAR | Status: DC | PRN

## 2021-06-22 MED ORDER — PROPOFOL 10 MG/ML IV BOLUS
INTRAVENOUS | Status: AC
  Filled 2021-06-22: qty 20

## 2021-06-22 MED ORDER — OXYCODONE HCL 5 MG PO TABS: 5.0000 mg | ORAL_TABLET | Freq: Once | ORAL | Status: DC | PRN

## 2021-06-22 MED ORDER — OXYMETAZOLINE HCL 0.05 % NA SOLN
NASAL | Status: DC | PRN
  Administered 2021-06-22: 1 via TOPICAL

## 2021-06-22 MED ORDER — DEXAMETHASONE SODIUM PHOSPHATE 10 MG/ML IJ SOLN
INTRAMUSCULAR | Status: DC | PRN
Start: 2021-06-22 — End: 2021-06-22
  Administered 2021-06-22: 10 mg via INTRAVENOUS

## 2021-06-22 MED ORDER — FENTANYL CITRATE (PF) 250 MCG/5ML IJ SOLN
INTRAMUSCULAR | Status: DC | PRN
  Administered 2021-06-22: 100 ug via INTRAVENOUS
  Administered 2021-06-22 (×3): 50 ug via INTRAVENOUS

## 2021-06-22 MED ORDER — FENTANYL CITRATE (PF) 250 MCG/5ML IJ SOLN
INTRAMUSCULAR | Status: AC
  Filled 2021-06-22: qty 5

## 2021-06-22 MED ORDER — OXYMETAZOLINE HCL 0.05 % NA SOLN
NASAL | Status: DC | PRN
  Administered 2021-06-22 (×2): 2 via NASAL

## 2021-06-22 MED ORDER — ROCURONIUM BROMIDE 10 MG/ML (PF) SYRINGE
PREFILLED_SYRINGE | INTRAVENOUS | Status: DC | PRN
  Administered 2021-06-22: 70 mg via INTRAVENOUS

## 2021-06-22 MED ORDER — LACTATED RINGERS IV SOLN: INTRAVENOUS | Status: DC

## 2021-06-22 MED ORDER — ONDANSETRON HCL 4 MG/2ML IJ SOLN
INTRAMUSCULAR | Status: DC | PRN
  Administered 2021-06-22: 4 mg via INTRAVENOUS

## 2021-06-22 MED ORDER — AMOXICILLIN 500 MG PO CAPS: 500.0000 mg | ORAL_CAPSULE | Freq: Three times a day (TID) | ORAL | 0 refills | Status: AC

## 2021-06-22 MED ORDER — MEPERIDINE HCL 25 MG/ML IJ SOLN: 6.2500 mg | INTRAMUSCULAR | Status: DC | PRN

## 2021-06-22 MED ORDER — ESMOLOL HCL 100 MG/10ML IV SOLN
INTRAVENOUS | Status: DC | PRN
  Administered 2021-06-22 (×3): 10 mg via INTRAVENOUS

## 2021-06-22 MED ORDER — CHLORHEXIDINE GLUCONATE 0.12 % MT SOLN
OROMUCOSAL | Status: AC
  Administered 2021-06-22: 15 mL via OROMUCOSAL
  Filled 2021-06-22: qty 15

## 2021-06-22 MED ORDER — INSULIN ASPART 100 UNIT/ML IJ SOLN: 0.0000 [IU] | INTRAMUSCULAR | Status: DC | PRN

## 2021-06-22 MED ORDER — PROPOFOL 10 MG/ML IV BOLUS
INTRAVENOUS | Status: DC | PRN
  Administered 2021-06-22: 50 mg via INTRAVENOUS
  Administered 2021-06-22: 150 mg via INTRAVENOUS

## 2021-06-22 MED ORDER — LIDOCAINE-EPINEPHRINE 2 %-1:100000 IJ SOLN
INTRAMUSCULAR | Status: DC | PRN
  Administered 2021-06-22: 20 mL via INTRADERMAL

## 2021-06-22 MED ORDER — LIDOCAINE 2% (20 MG/ML) 5 ML SYRINGE
INTRAMUSCULAR | Status: DC | PRN
  Administered 2021-06-22: 60 mg via INTRAVENOUS

## 2021-06-22 SURGICAL SUPPLY — 36 items
BAG COUNTER SPONGE SURGICOUNT (BAG) IMPLANT
BLADE SURG 15 STRL LF DISP TIS (BLADE) ×1 IMPLANT
BLADE SURG 15 STRL SS (BLADE) ×1
BUR CROSS CUT FISSURE 1.6 (BURR) ×2 IMPLANT
BUR EGG ELITE 4.0 (BURR) ×2 IMPLANT
CANISTER SUCT 3000ML PPV (MISCELLANEOUS) ×2 IMPLANT
COVER SURGICAL LIGHT HANDLE (MISCELLANEOUS) ×2 IMPLANT
DECANTER SPIKE VIAL GLASS SM (MISCELLANEOUS) ×2 IMPLANT
DRAPE U-SHAPE 76X120 STRL (DRAPES) ×2 IMPLANT
GAUZE PACKING FOLDED 2  STR (GAUZE/BANDAGES/DRESSINGS) ×1
GAUZE PACKING FOLDED 2 STR (GAUZE/BANDAGES/DRESSINGS) ×1 IMPLANT
GLOVE SURG ENC MOIS LTX SZ6.5 (GLOVE) IMPLANT
GLOVE SURG ENC MOIS LTX SZ7 (GLOVE) IMPLANT
GLOVE SURG ENC MOIS LTX SZ8 (GLOVE) ×2 IMPLANT
GLOVE SURG UNDER POLY LF SZ6.5 (GLOVE) IMPLANT
GLOVE SURG UNDER POLY LF SZ7 (GLOVE) IMPLANT
GOWN STRL REUS W/ TWL LRG LVL3 (GOWN DISPOSABLE) ×1 IMPLANT
GOWN STRL REUS W/ TWL XL LVL3 (GOWN DISPOSABLE) ×1 IMPLANT
GOWN STRL REUS W/TWL LRG LVL3 (GOWN DISPOSABLE) ×1
GOWN STRL REUS W/TWL XL LVL3 (GOWN DISPOSABLE) ×1
IV NS 1000ML (IV SOLUTION) ×1
IV NS 1000ML BAXH (IV SOLUTION) ×1 IMPLANT
KIT BASIN OR (CUSTOM PROCEDURE TRAY) ×2 IMPLANT
KIT TURNOVER KIT B (KITS) ×2 IMPLANT
NDL HYPO 25GX1X1/2 BEV (NEEDLE) ×2 IMPLANT
NEEDLE HYPO 25GX1X1/2 BEV (NEEDLE) ×4 IMPLANT
NS IRRIG 1000ML POUR BTL (IV SOLUTION) ×2 IMPLANT
PAD ARMBOARD 7.5X6 YLW CONV (MISCELLANEOUS) ×2 IMPLANT
SLEEVE IRRIGATION ELITE 7 (MISCELLANEOUS) ×2 IMPLANT
SPONGE SURGIFOAM ABS GEL 12-7 (HEMOSTASIS) IMPLANT
SUT CHROMIC 3 0 PS 2 (SUTURE) ×3 IMPLANT
SYR BULB IRRIG 60ML STRL (SYRINGE) ×2 IMPLANT
SYR CONTROL 10ML LL (SYRINGE) ×2 IMPLANT
TRAY ENT MC OR (CUSTOM PROCEDURE TRAY) ×2 IMPLANT
TUBING IRRIGATION (MISCELLANEOUS) ×2 IMPLANT
YANKAUER SUCT BULB TIP NO VENT (SUCTIONS) ×2 IMPLANT

## 2021-06-22 NOTE — H&P (Signed)
H&P documentation  -History and Physical Reviewed  -Patient has been re-examined  -No change in the plan of care  Micheal Owens  

## 2021-06-22 NOTE — Op Note (Signed)
06/22/2021 ? ?10:28 AM ? ?PATIENT:  Micheal Owens  71 y.o. male ? ?PRE-OPERATIVE DIAGNOSIS: NON--RESTORABLE TEETH # 6, 9, 11, 19, 20, 21, 22, 23, 24, 25, 27, 28, 29, 30, 31, 32, SECONDARY TO  DENTAL CARIES, Maxillary hyperplastic tuberosity right, palatal torus maxilla ? ?POST-OPERATIVE DIAGNOSIS:  SAME ? ?PROCEDURE:  Procedure(s): ?EXTRACTIONS TEETH # 6, 9, 11, 19, 20, 21, 22, 23, 24, 25, 27, 28, 29, 30, 31, 32, ALVEOLOPLASTY, REDUCTION Maxillary hyperplastic tuberosity right, REMOVAL palatal torus maxilla ? ?SURGEON:  Surgeon(s): ?Diona Browner, DMD ? ?ANESTHESIA:   local and general ? ?EBL:  150 CC ? ?DRAINS: none  ? ?SPECIMEN:  No Specimen ? ?COUNTS:  YES ? ?PLAN OF CARE: Discharge to home after PACU ? ?PATIENT DISPOSITION:  PACU - hemodynamically stable. ?  ?PROCEDURE DETAILS: ?Dictation # [id number] ? ?Gae Bon, DMD ?06/22/2021 ?10:28 AM ? ? ? ? ? ? ? ? ? ? ? ? ? ? ? ?  ?

## 2021-06-22 NOTE — Transfer of Care (Signed)
Immediate Anesthesia Transfer of Care Note ? ?Patient: Micheal Owens ? ?Procedure(s) Performed: DENTAL RESTORATION/EXTRACTIONS (Mouth) ? ?Patient Location: PACU ? ?Anesthesia Type:General ? ?Level of Consciousness: drowsy ? ?Airway & Oxygen Therapy: Patient Spontanous Breathing ? ?Post-op Assessment: Report given to RN and Post -op Vital signs reviewed and stable ? ?Post vital signs: Reviewed and stable ? ?Last Vitals:  ?Vitals Value Taken Time  ?BP 164/141 06/22/21 1037  ?Temp    ?Pulse 137 06/22/21 1038  ?Resp    ?SpO2 100 % 06/22/21 1038  ?Vitals shown include unvalidated device data. ? ?Last Pain:  ?Vitals:  ? 06/22/21 0622  ?TempSrc: Oral  ?PainSc:   ?   ? ?Patients Stated Pain Goal: 2 (06/20/21 1859) ? ?Complications: No notable events documented. ?

## 2021-06-22 NOTE — Op Note (Signed)
NAME: Micheal Owens, Micheal Owens ?MEDICAL RECORD NO: [id number] ?ACCOUNT NO: [id number] ?DATE OF BIRTH: February 21, 1951 ?FACILITY: [facility] ?LOCATION: [facility] ?PHYSICIAN: Georgia Lopes, DDS ? ?Operative Report  ? ?DATE OF PROCEDURE: 06/22/2021 ? 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 Maxillary hyperplastic right tuberosity, maxillary palatal torus. ? 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 Maxillary hyperplastic right tuberosity, maxillary palatal torus. ? ?PROCEDURES:  Extraction of teeth numbers 6, 9, 11, 19, 20, 21, 22, 23, 24, 25, 27, 28, 29, 30, 31, 32.  Alveoloplasty right and left maxilla and mandible. Reduction maxillary hyperplastic tuberosity, removal of maxillary palatal torus. ? ?SURGEON:  Georgia Lopes, DDS ? ?ANESTHESIA:  General, nasal intubation, [facility] attending. ? ?DESCRIPTION OF PROCEDURE:  The patient was taken to the operating room and placed on the table in supine position.  General anesthesia was administered.  Nasoendotracheal intubation was achieved and secured.  The eyes were protected and the patient was  ?draped for surgery.  Timeout was performed.  The posterior pharynx was suctioned and a throat pack was placed.  2% lidocaine 1:100,000 epinephrine was infiltrated in an inferior alveolar block on the right and left sides and in buccal and palatal  ?infiltration of the maxilla around the teeth and the palatal areas.  A bite block was placed on the right side of the mouth.  A sweetheart retractor was used to retract the tongue.  A 15 blade was used to make an incision around teeth numbers 19, 20, 21, ? 22, 23, 24, 25 on the buccal and lingual aspect of the gingival area.  There was tissue edema, erythema and the remaining teeth were actively mobile.  The  teeth were removed with the dental forceps.  The sockets were curetted.  Tissue was debrided and  ?prepared for primary closure.  Then, the periosteum was reflected to expose the alveolar crest and alveoplasty was performed using the egg bur.  Then, the area was irrigated and closed with 3-0 chromic.  Then, the right maxilla was operated last. Next, a ? 15 blade was used to make an incision around teeth numbers 27, 28, 29, 30, 31 and 32.  The periosteum was reflected and the teeth were elevated and removed with the dental forceps.  The sockets were curetted, irrigated. Tissue was debrided and the  ?alveoplasty was performed using the egg bur followed by the bone file.  Then, the areas were irrigated and closed with 3-0 chromic.  Then, in the maxilla, the 15 blade was used to make an incision around teeth numbers 11, 9 and 6, and connected on the  ?alveolar crest.  The periosteum was reflected.  The teeth were removed with dental forceps and then debrided, curetted and the tissue was trimmed to allow for closure and then the area was closed with 3-0 chromic.  Then, the left palatal tuberosity was  ?removed using a 15 blade to make a curvilinear incision. The tissue was reflected and then the bone was smoothed with a bone file and the egg bur under irrigation.  Then, this area was closed with 3-0 chromic and then the right maxillary tuberosity was  ?reduced using a 15 blade to make an incision. Periosteal elevator was used to expose the tuberosity.  The bone was reduced  using the egg bur followed by the bone file and then sutured with 3-0 chromic.  The oral cavity was then irrigated and suctioned.   ?The throat pack was removed.  Additional anesthesia was administered and the patient was left under care of anesthesia for extubation and transferred to recovery room with plans for discharge home through day surgery. ? ?ESTIMATED BLOOD LOSS:  150 mL  ? ?SPECIMENS:  No specimens. ? ?DRAINS: No drains. ? ? ?PAA ?D:  06/22/2021 10:34:41 am T: 06/22/2021 11:17:00 am  ?JOB: [id number]/ [id number]  ?

## 2021-06-22 NOTE — Anesthesia Postprocedure Evaluation (Signed)
Anesthesia Post Note ? ?Patient: Micheal Owens ? ?Procedure(s) Performed: DENTAL RESTORATION/EXTRACTIONS (Mouth) ? ?  ? ?Patient location during evaluation: PACU ?Anesthesia Type: General ?Level of consciousness: awake and alert ?Pain management: pain level controlled ?Vital Signs Assessment: post-procedure vital signs reviewed and stable ?Respiratory status: spontaneous breathing, nonlabored ventilation and respiratory function stable ?Cardiovascular status: blood pressure returned to baseline and stable ?Postop Assessment: no apparent nausea or vomiting ?Anesthetic complications: no ? ? ?No notable events documented. ? ?Last Vitals:  ?Vitals:  ? 06/22/21 1053 06/22/21 1108  ?BP: (!) 178/98 (!) 158/92  ?Pulse: (!) 111 (!) 105  ?Resp: (!) 23 19  ?Temp:  36.5 ?C  ?SpO2: 100% 100%  ?  ?Last Pain:  ?Vitals:  ? 06/22/21 1108  ?TempSrc:   ?PainSc: 0-No pain  ? ? ?  ?  ?  ?  ?  ?  ? ?Lynda Rainwater ? ? ? ? ?

## 2021-06-22 NOTE — H&P (Signed)
Anesthesia H&P Update: History and Physical Exam reviewed; patient is OK for planned anesthetic and procedure. ? ?

## 2021-06-22 NOTE — Anesthesia Procedure Notes (Signed)
Procedure Name: Intubation ?Date/Time: 06/22/2021 9:24 AM ?Performed by: Lance Coon, CRNA ?Pre-anesthesia Checklist: Patient identified, Emergency Drugs available, Suction available, Patient being monitored and Timeout performed ?Patient Re-evaluated:Patient Re-evaluated prior to induction ?Oxygen Delivery Method: Circle system utilized ?Preoxygenation: Pre-oxygenation with 100% oxygen ?Induction Type: IV induction ?Ventilation: Mask ventilation without difficulty ?Laryngoscope Size: Sabra Heck and 3 ?Nasal Tubes: Right, Nasal prep performed and Nasal Dwyane Luo ?Tube size: 7.0 mm ?Number of attempts: 1 ?Placement Confirmation: ETT inserted through vocal cords under direct vision, positive ETCO2 and breath sounds checked- equal and bilateral ?Tube secured with: Tape ?Dental Injury: Teeth and Oropharynx as per pre-operative assessment  ? ? ? ? ?

## 2021-06-23 ENCOUNTER — Encounter (HOSPITAL_COMMUNITY): Payer: Self-pay | Admitting: Oral Surgery

## 2022-07-29 ENCOUNTER — Ambulatory Visit: Payer: Self-pay | Admitting: *Deleted

## 2022-07-29 NOTE — Telephone Encounter (Signed)
Summary: Fatigue Advice   Pts wife is calling to request if the patient could be seen sooner - report fatigue sleep lots, decreased appetite. Report change in the patient since his sister passed away in 29-Jun-2022.         Reason for Disposition  [1] MODERATE weakness (i.e., interferes with work, school, normal activities) AND [2] cause unknown  (Exceptions: Weakness from acute minor illness or poor fluid intake; weakness is chronic and not worse.)  Answer Assessment - Initial Assessment Questions 1. DESCRIPTION: "Describe how you are feeling."     Easily fatigued 2. SEVERITY: "How bad is it?"  "Can you stand and walk?"   - MILD (0-3): Feels weak or tired, but does not interfere with work, school or normal activities.   - MODERATE (4-7): Able to stand and walk; weakness interferes with work, school, or normal activities.   - SEVERE (8-10): Unable to stand or walk; unable to do usual activities.     Patient gets up and does self care- easily fatigued- stamina is less 3. ONSET: "When did these symptoms begin?" (e.g., hours, days, weeks, months)     Noticed change in January  4. CAUSE: "What do you think is causing the weakness or fatigue?" (e.g., not drinking enough fluids, medical problem, trouble sleeping)     Not sure why the change 5. NEW MEDICINES:  "Have you started on any new medicines recently?" (e.g., opioid pain medicines, benzodiazepines, muscle relaxants, antidepressants, antihistamines, neuroleptics, beta blockers)     Prostate medications-enlarged- only change  6. OTHER SYMPTOMS: "Do you have any other symptoms?" (e.g., chest pain, fever, cough, SOB, vomiting, diarrhea, bleeding, other areas of pain)     Frequent urination  Protocols used: Weakness (Generalized) and Fatigue-A-AH

## 2022-07-29 NOTE — Telephone Encounter (Signed)
Patient wife calling to request sooner appointment: she reports (patient there with her) Chief Complaint: fatigue- lack of energy  Symptoms: tired easily- unable to have stamina to do activities he used to do Frequency: noticed difference in January Pertinent Negatives: Patient denies chest pain, fever, cough, SOB, vomiting, diarrhea, bleeding, other areas of pain Disposition: ED /[x] Urgent Care (no appt availability in office) / Appointment(In office/virtual)/  [facility]/ Home Care/ Refused Recommended Disposition /[] [facility]/  Follow-up with PCP Additional Notes: Patient's wife is calling with concerns about patient fatigue- weakness-  patient is diabetic and not taking medication now. Patient also has enlarged prostate with urinary symptoms. Patient has NP appointment- and will add to cancellation list. Advised UC per disposition- wife states she does not like UC- they don't do anything.Patient has been added to cancellation list

## 2022-08-15 NOTE — Progress Notes (Deleted)
   There were no vitals taken for this visit.   Subjective:    Patient ID: Micheal Owens, male    DOB: 1950-11-17, 72 y.o.   MRN: [id number]  HPI: Micheal Owens is a 72 y.o. male  No chief complaint on file.  Establish care: his last physical was ***.  Medical history includes ***.  Family history includes ***.  Health Maintenance ***.   Relevant past medical, surgical, family and social history reviewed and updated as indicated. Interim medical history since our last visit reviewed. Allergies and medications reviewed and updated.  Review of Systems  Constitutional: Negative for fever or weight change.  Respiratory: Negative for cough and shortness of breath.   Cardiovascular: Negative for chest pain or palpitations.  Gastrointestinal: Negative for abdominal pain, no bowel changes.  Musculoskeletal: Negative for gait problem or joint swelling.  Skin: Negative for rash.  Neurological: Negative for dizziness or headache.  No other specific complaints in a complete review of systems (except as listed in HPI above).      Objective:    There were no vitals taken for this visit.  Wt Readings from Last 3 Encounters:  06/22/21 155 lb (70.3 kg)  03/16/21 163 lb (73.9 kg)  07/14/20 186 lb (84.4 kg)    Physical Exam  Constitutional: Patient appears well-developed and well-nourished. Obese *** No distress.  HEENT: head atraumatic, normocephalic, pupils equal and reactive to light, ears ***, neck supple, throat within normal limits Cardiovascular: Normal rate, regular rhythm and normal heart sounds.  No murmur heard. No BLE edema. Pulmonary/Chest: Effort normal and breath sounds normal. No respiratory distress. Abdominal: Soft.  There is no tenderness. Psychiatric: Patient has a normal mood and affect. behavior is normal. Judgment and thought content normal.  Results for orders placed or performed during the hospital encounter of 06/22/21  Glucose, capillary  Result Value Ref Range    Glucose-Capillary 140 (H) 70 - 99 mg/dL  Basic metabolic panel per protocol  Result Value Ref Range   Sodium 138 135 - 145 mmol/L   Potassium 3.9 3.5 - 5.1 mmol/L   Chloride 106 98 - 111 mmol/L   CO2 24 22 - 32 mmol/L   Glucose, Bld 115 (H) 70 - 99 mg/dL   BUN 13 8 - 23 mg/dL   Creatinine, Ser 1.47 0.61 - 1.24 mg/dL   Calcium 8.9 8.9 - 82.9 mg/dL   GFR, Estimated >56 >21 mL/min   Anion gap 8 5 - 15  CBC per protocol  Result Value Ref Range   WBC 3.8 (L) 4.0 - 10.5 K/uL   RBC 4.29 4.22 - 5.81 MIL/uL   Hemoglobin 12.8 (L) 13.0 - 17.0 g/dL   HCT 30.8 65.7 - 84.6 %   MCV 91.6 80.0 - 100.0 fL   MCH 29.8 26.0 - 34.0 pg   MCHC 32.6 30.0 - 36.0 g/dL   RDW 96.2 95.2 - 84.1 %   Platelets 190 150 - 400 K/uL   nRBC 0.0 0.0 - 0.2 %  Glucose, capillary  Result Value Ref Range   Glucose-Capillary 193 (H) 70 - 99 mg/dL      Assessment & Plan:   Problem List Items Addressed This Visit   None    Follow up plan: No follow-ups on file.

## 2022-08-16 ENCOUNTER — Ambulatory Visit: Payer: 59 | Admitting: Nurse Practitioner

## 2022-08-19 ENCOUNTER — Ambulatory Visit (INDEPENDENT_AMBULATORY_CARE_PROVIDER_SITE_OTHER): Payer: 59 | Admitting: Podiatry

## 2022-08-19 ENCOUNTER — Encounter: Payer: Self-pay | Admitting: Podiatry

## 2022-08-19 VITALS — BP 159/85

## 2022-08-19 DIAGNOSIS — B351 Tinea unguium: Secondary | ICD-10-CM

## 2022-08-19 DIAGNOSIS — L84 Corns and callosities: Secondary | ICD-10-CM | POA: Diagnosis not present

## 2022-08-19 DIAGNOSIS — M79675 Pain in left toe(s): Secondary | ICD-10-CM

## 2022-08-19 DIAGNOSIS — E119 Type 2 diabetes mellitus without complications: Secondary | ICD-10-CM | POA: Diagnosis not present

## 2022-08-19 DIAGNOSIS — E1151 Type 2 diabetes mellitus with diabetic peripheral angiopathy without gangrene: Secondary | ICD-10-CM | POA: Diagnosis not present

## 2022-08-19 DIAGNOSIS — M79674 Pain in right toe(s): Secondary | ICD-10-CM | POA: Diagnosis not present

## 2022-08-19 NOTE — Progress Notes (Signed)
Subjective: Micheal Owens presents today for: Chief Complaint  Patient presents with   Nail Problem    DFC,PCP:   [facility],A1C:6.0-9 mos ago,LOV-07/23     Patient denies any h/o foot wounds.  Patient denies any numbness, tingling, burning, or pins/needle sensation in feet.  Risk factors: diabetes, PAD, h/o CVA, hyperlipidemia, h/o tobacco use in remission.  PCP is Bernita Raisin, MD.  Past Medical History:  Diagnosis Date   Diabetes mellitus without complication (HCC)    Type II   High cholesterol    Hypertension    PVD (peripheral vascular disease) (HCC)    Stroke (HCC)    Substance abuse [facility])     Patient Active Problem List   Diagnosis Date Noted   Cocaine abuse (HCC)    Acute CVA (cerebrovascular accident) (HCC) 06/19/2020   Essential hypertension 06/18/2020   Type 2 diabetes mellitus with hyperlipidemia (HCC) 06/18/2020   PAD (peripheral artery disease) (HCC) 06/18/2020   History of BPH 06/18/2020   History of alcohol abuse 06/18/2020   Stroke-like episode 06/18/2020   Stroke (HCC) 06/18/2020    Past Surgical History:  Procedure Laterality Date   FEMORAL ARTERY STENT     TOOTH EXTRACTION N/A 06/22/2021   Procedure: DENTAL RESTORATION/EXTRACTIONS;  Surgeon: Ocie Doyne, DMD;  Location: [facility] OR;  Service: Oral Surgery;  Laterality: N/A;    Current Outpatient Medications on File Prior to Visit  Medication Sig Dispense Refill   acetaminophen (TYLENOL) 500 MG tablet Take 1,000 mg by mouth every 6 (six) hours as needed for moderate pain.     amLODipine (NORVASC) 10 MG tablet Take 1 tablet (10 mg total) by mouth daily. 30 tablet 0   amoxicillin (AMOXIL) 500 MG capsule Take 1 capsule (500 mg total) by mouth 3 (three) times daily. 21 capsule 0   atorvastatin (LIPITOR) 80 MG tablet Take 1 tablet (80 mg total) by mouth daily. 30 tablet 0   Cholecalciferol (VITAMIN D) 50 MCG (2000 UT) tablet Take 2,000 Units by mouth daily.      clopidogrel (PLAVIX) 75 MG tablet Take 1 tablet (75 mg total) by mouth daily. 30 tablet 0   diphenhydramine-acetaminophen (TYLENOL PM) 25-500 MG TABS tablet Take 2 tablets by mouth at bedtime as needed (sleep).     metFORMIN (GLUCOPHAGE) 500 MG tablet Take 500 mg by mouth 2 (two) times daily.     naltrexone (DEPADE) 50 MG tablet Take 100 mg by mouth daily.     oxyCODONE-acetaminophen (PERCOCET) 5-325 MG tablet Take 1 tablet by mouth every 4 (four) hours as needed. 20 tablet 0   tamsulosin (FLOMAX) 0.4 MG CAPS capsule Take 0.8 mg by mouth daily.     No current facility-administered medications on file prior to visit.     No Known Allergies  Social History   Occupational History   Not on file  Tobacco Use   Smoking status: Former   Smokeless tobacco: Never  Vaping Use   Vaping Use: Never used  Substance and Sexual Activity   Alcohol use: Yes    Comment: occ   Drug use: Yes    Types: Cocaine    Comment: Last used 06/09/21   Sexual activity: Not on file    History reviewed. No pertinent family history.  Immunization History  Administered Date(s) Administered   Moderna Sars-Covid-2 Vaccination 05/14/2019, 06/11/2019, 02/07/2020, 11/24/2020    Objective: Vitals:   08/19/22 0831  BP: (!) 159/85    Micheal Owens is a pleasant 72 y.o. male  WD, WN in NAD. AAO X 3.  Vascular Examination: CFT <4 seconds b/l. No pedal edema b/l. DP pulses diminished b/l. PT pulses diminished b/l. Digital hair absent. Skin temperature gradient warm to warm b/l. No ischemia or gangrene. No cyanosis or clubbing noted b/l.    Neurological Examination: Sensation grossly intact b/l with 10 gram monofilament. Vibratory sensation intact b/l.   Dermatological Examination: Pedal skin thin, shiny and atrophic b/l. No open wounds. No interdigital macerations.   Toenails 1-5 b/l thick, discolored, elongated with subungual debris and pain on dorsal palpation.   Hyperkeratotic lesion(s) submet head 1 right  foot and submet head 5 b/l.  No erythema, no edema, no drainage, no fluctuance.  Musculoskeletal Examination: Muscle strength 5/5 to b/l LE. Pes planus deformity noted bilateral LE. Patient ambulates independent of any assistive aids.  Radiographs: None  Lab Results  Component Value Date   HGBA1C 7.5 (H) 06/18/2020   No results found.  Assessment: 1. Pain due to onychomycosis of toenails of both feet   2. Callus   3. Type II diabetes mellitus with peripheral circulatory disorder (HCC)   4. Encounter for diabetic foot exam (HCC)     ADA Risk Categorization: High Risk:  Patient has one or more of the following: Loss of protective sensation Absent pedal pulses Severe Foot deformity History of foot ulcer  Plan: -Patient was evaluated and treated. All patient's and/or POA's questions/concerns answered on today's visit. -Diabetic foot examination performed today. -Continue diabetic foot care principles: inspect feet daily, monitor glucose as recommended by PCP and/or Endocrinologist, and follow prescribed diet per PCP, Endocrinologist and/or dietician. -Patient to continue soft, supportive shoe gear daily. -Toenails 1-5 b/l were debrided in length and girth with sterile nail nippers without iatrogenic bleeding.  -Callus(es) submet head 1 right foot and submet head 5 b/l pared utilizing sterile scalpel blade without complication or incident. Total number debrided =3. -Patient/POA to call should there be question/concern in the interim.  Return in about 3 months (around 11/19/2022).  Freddie Breech, DPM

## 2022-10-17 DIAGNOSIS — E119 Type 2 diabetes mellitus without complications: Secondary | ICD-10-CM | POA: Diagnosis not present

## 2022-10-17 DIAGNOSIS — I10 Essential (primary) hypertension: Secondary | ICD-10-CM | POA: Diagnosis not present

## 2022-10-17 DIAGNOSIS — I639 Cerebral infarction, unspecified: Secondary | ICD-10-CM | POA: Diagnosis not present

## 2022-11-05 IMAGING — CT CT ANGIO NECK
2 of 7 series · 8 of 33 positions shown · IV contrast (APPLIED)
Comparison: None.

CLINICAL DATA: Right arm weakness, confusion, and word-finding
difficulty.

EXAM:
CT ANGIOGRAPHY HEAD AND NECK
TECHNIQUE: Multidetector CT imaging of the head and neck was performed using
the standard protocol during bolus administration of intravenous
contrast. Multiplanar CT image reconstructions and MIPs were
obtained to evaluate the vascular anatomy. Carotid stenosis
measurements (when applicable) are obtained utilizing NASCET
criteria, using the distal internal carotid diameter as the
denominator.
CONTRAST:  75mL OMNIPAQUE IOHEXOL 350 MG/ML SOLN

[Series 4: cta head neck · axial · 0.45mm/px · z∈[-272,-158]mm · 2 of 173 slices shown]
[im 58/173  soft-tissue]
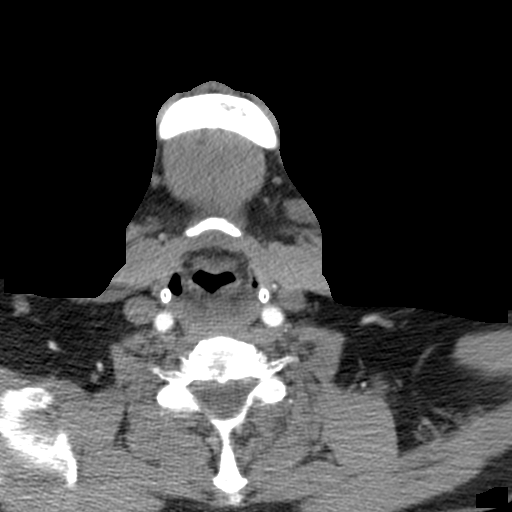
[im 115/173  bone]
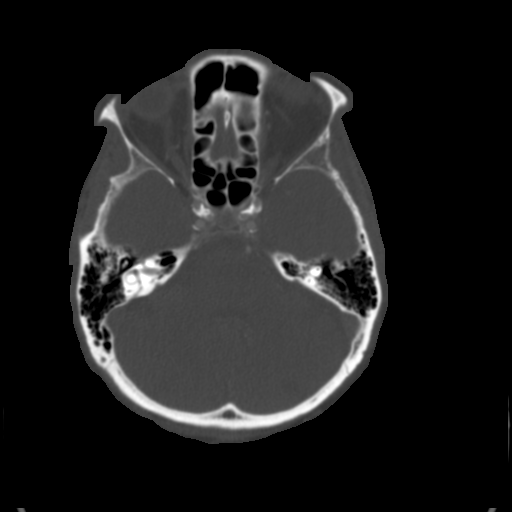

[Series 6: ax thin · axial · 0.40mm/px · z∈[-365,-127]mm · 6 of 346 slices shown]
[im 50/346  soft-tissue]
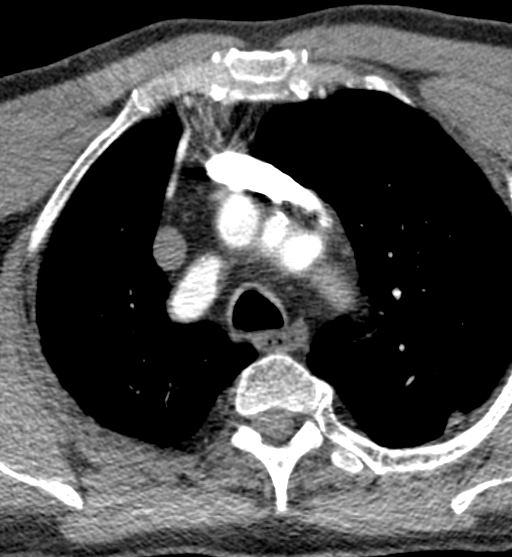
[im 99/346  soft-tissue]
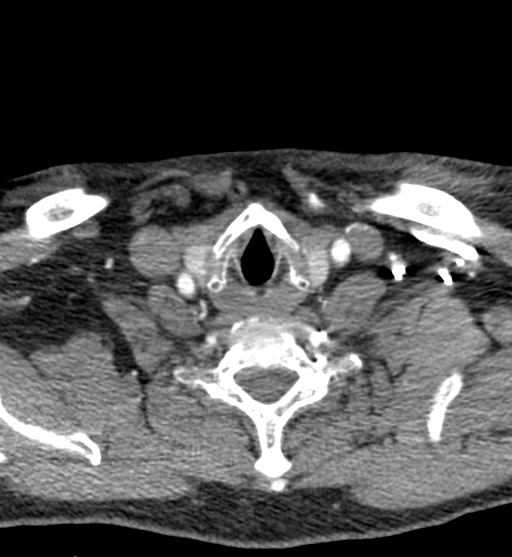
[im 148/346  soft-tissue]
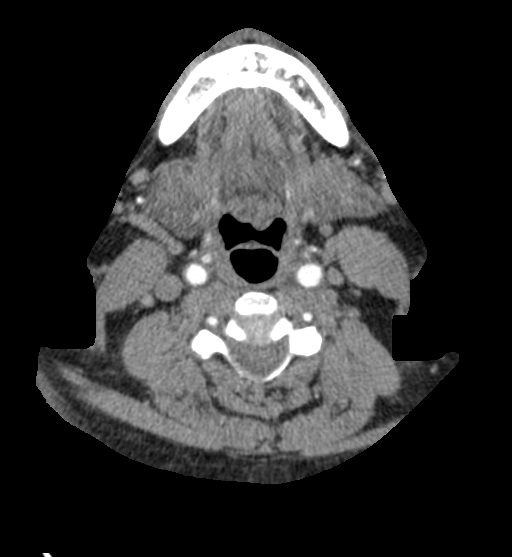
[im 198/346  soft-tissue]
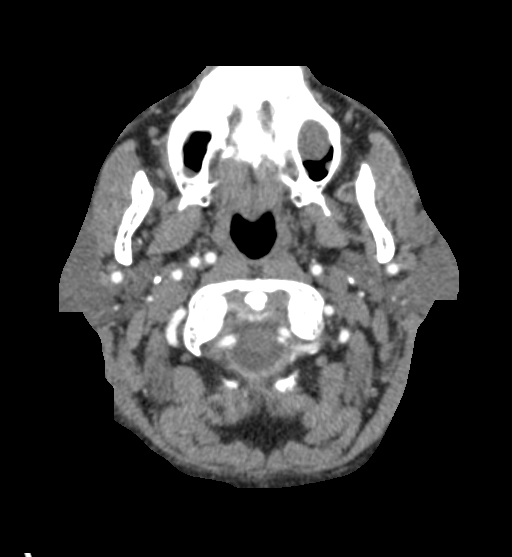
[im 247/346  soft-tissue]
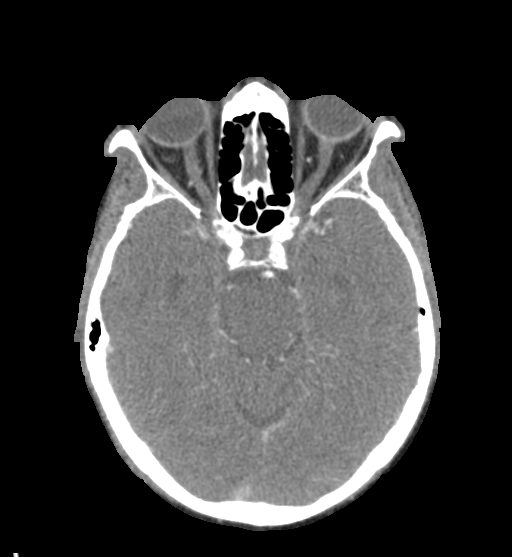
[im 296/346  soft-tissue]
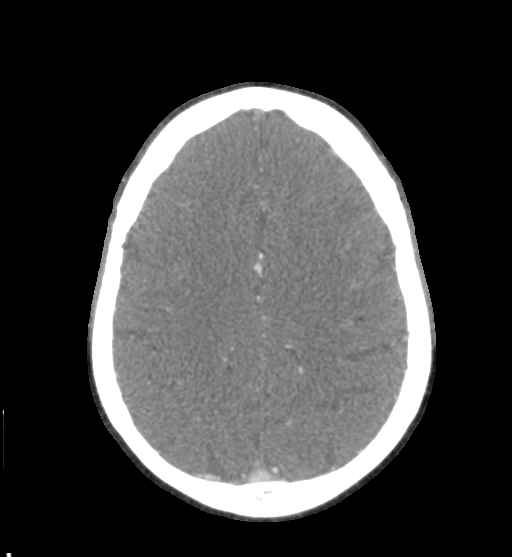

[8 of 33 positions shown; findings below may reference images not displayed]

FINDINGS: CTA NECK FINDINGS

Aortic arch: Standard 3 a cell aortic arch with mild atherosclerotic
plaque. No significant arch vessel origin stenosis.

Right carotid system: Patent with moderate calcified plaque at the
carotid bifurcation. No evidence of a significant stenosis or
dissection. Tortuous distal cervical ICA.

Left carotid system: Patent with extensive calcified plaque at the
carotid bifurcation resulting in 55% stenosis of the distal common
carotid artery. No significant ICA stenosis. Tortuous proximal ICA.

Vertebral arteries: The vertebral arteries are patent and codominant
without evidence of a significant stenosis on the left within
limitations of artifact through the origin. Calcified plaque results
in moderate stenosis of the right vertebral artery origin and mild
stenosis of the right V1 segment distal to the origin.

Skeleton: Moderate disc degeneration at C5-6.

Other neck: No evidence of cervical lymphadenopathy or mass.

Upper chest: Clear lung apices.

Review of the MIP images confirms the above findings

CTA HEAD FINDINGS

Anterior circulation: The internal carotid arteries are patent from
skull base to carotid termini with prominently calcified plaque
resulting in likely mild to moderate cavernous and mild supraclinoid
stenosis bilaterally with assessment limited by blooming from the
dense calcification. ACAs and MCAs are patent without evidence of a
proximal branch occlusion or significant A1 or right M1 stenosis.
There is a severe proximal left M1 stenosis with mild asymmetric
diffuse attenuation of left MCA branch vessels compared to the
right. The left ACA is dominant. No aneurysm is identified.

Posterior circulation: The intracranial vertebral arteries are
patent to the basilar with prominent calcified plaque in the right
V4 segment resulting in mild stenosis. Mild left V4 segment plaque
does not result in significant stenosis. Patent left PICA, right
AICA, and bilateral SCA origins are identified. The basilar artery
is widely patent. Posterior communicating arteries are diminutive or
absent. The PCAs are patent with mild irregular narrowing of the
left greater than right P2 segments. No aneurysm is identified.

Venous sinuses: As permitted by contrast timing, patent.

Anatomic variants: Dominant left ACA.

Review of the MIP images confirms the above findings
IMPRESSION: 1. No large vessel occlusion.
2. Intracranial atherosclerosis including severe left M1 and
mild-to-moderate bilateral ICA stenoses.
3. 55% distal left common carotid artery stenosis.
4. Moderate right vertebral artery origin stenosis.
5. Aortic Atherosclerosis ([id number]-W73.3).

## 2022-11-05 IMAGING — CT CT HEAD W/O CM
4 series · 16 of 47 positions shown, 18 images · non-contrast
Comparison: None.

CLINICAL DATA: Altered mental status

EXAM:
CT HEAD WITHOUT CONTRAST
TECHNIQUE: Contiguous axial images were obtained from the base of the skull
through the vertex without intravenous contrast.

[Series 2: head bone · axial · 0.45mm/px · z∈[-99,-69]mm · 3 of 75 slices shown]
[im 8/75  bone]
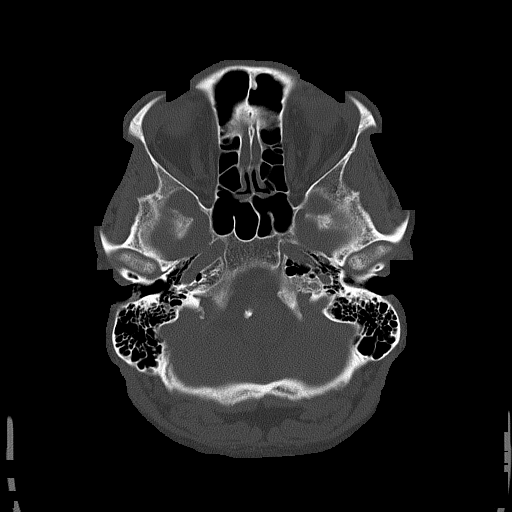
[im 15/75  bone]
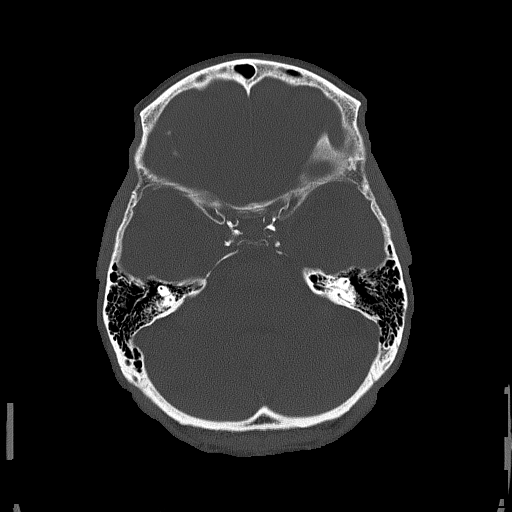
[im 23/75  bone]
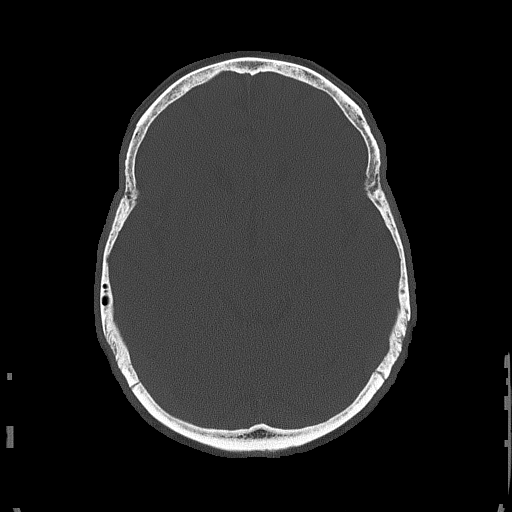

[Series 3: head wo · axial · 0.45mm/px · z∈[-98,+12]mm · 7 of 30 slices shown, 9 images]
[im 4/30  brain]
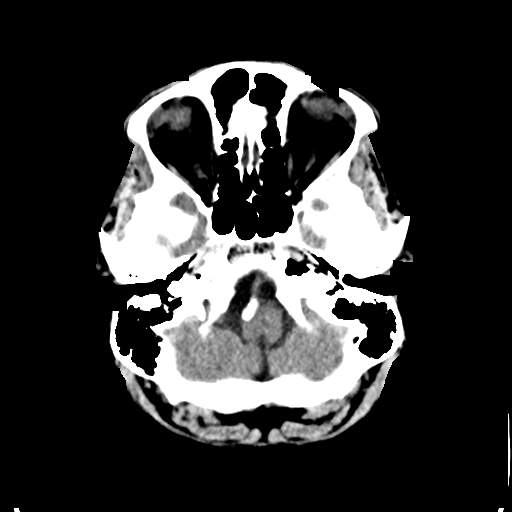
[im 4/30  bone]
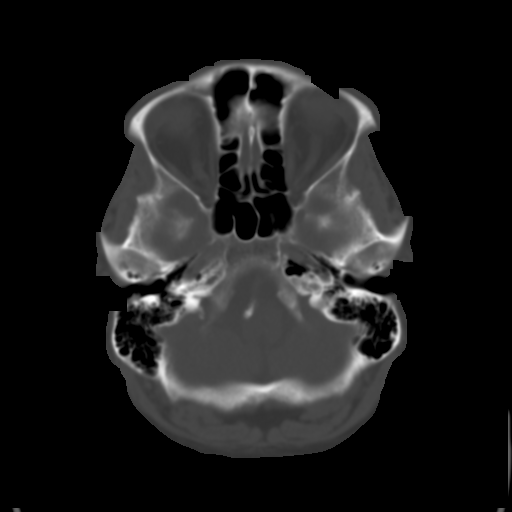
[im 8/30  brain]
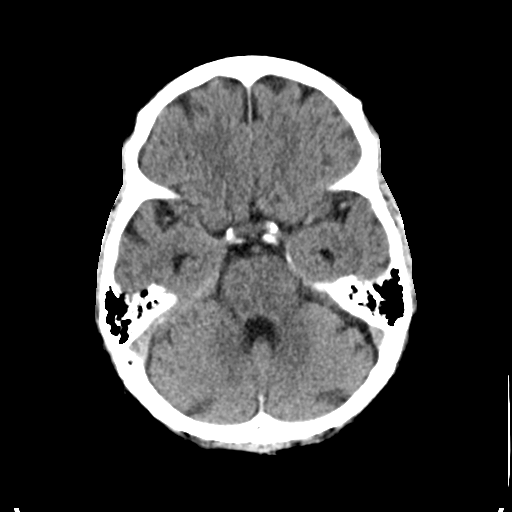
[im 11/30  brain]
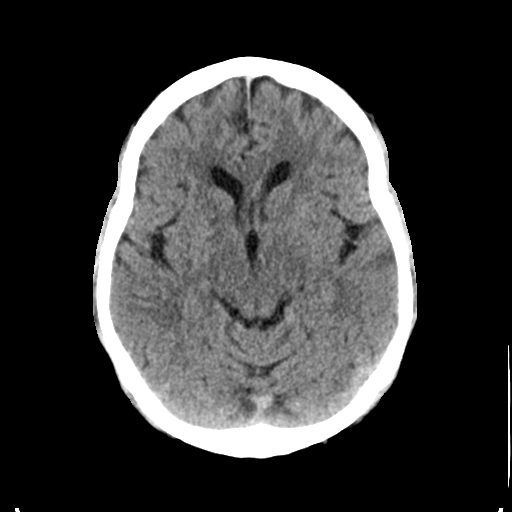
[im 15/30  brain]
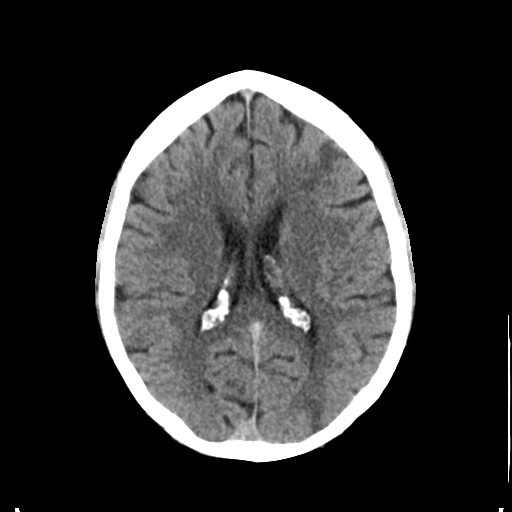
[im 19/30  brain]
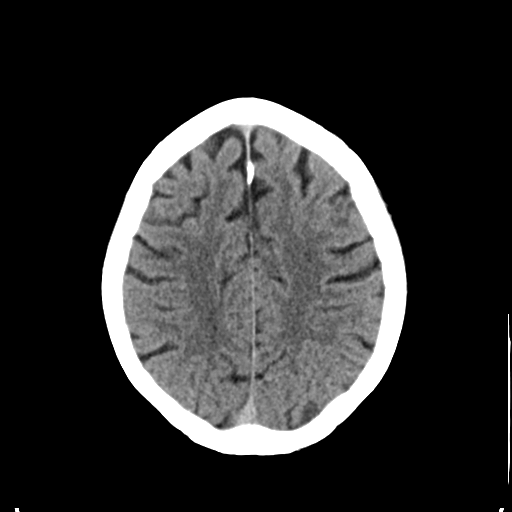
[im 19/30  bone]
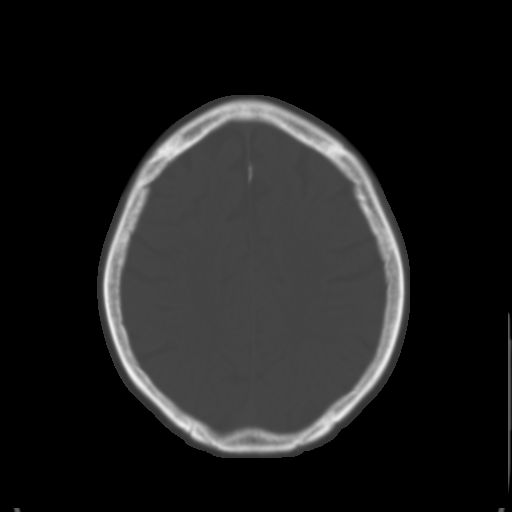
[im 22/30  brain]
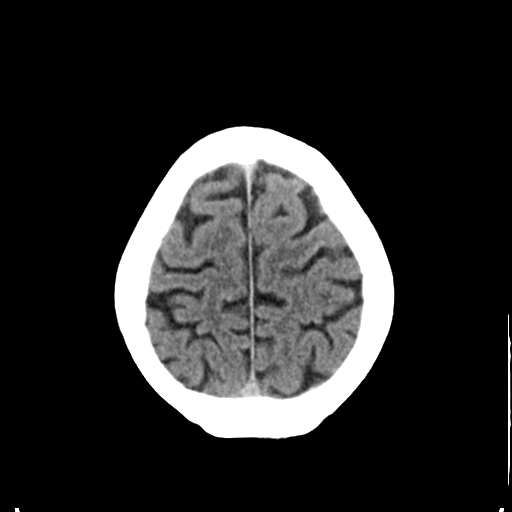
[im 26/30  brain]
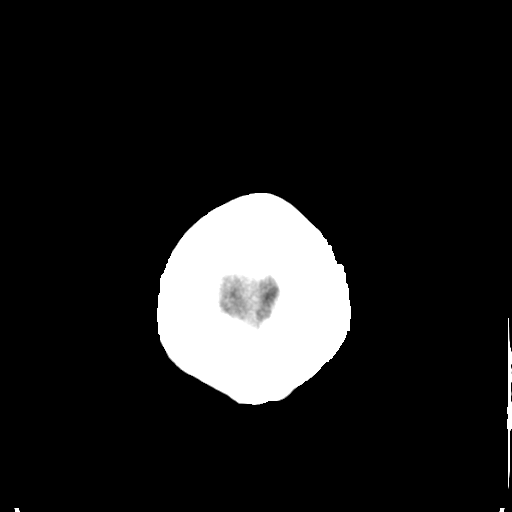

[Series 4: coronal soft tissue · coronal · 0.31mm/px · 3 of 67 slices shown]
[im 23/67  brain]
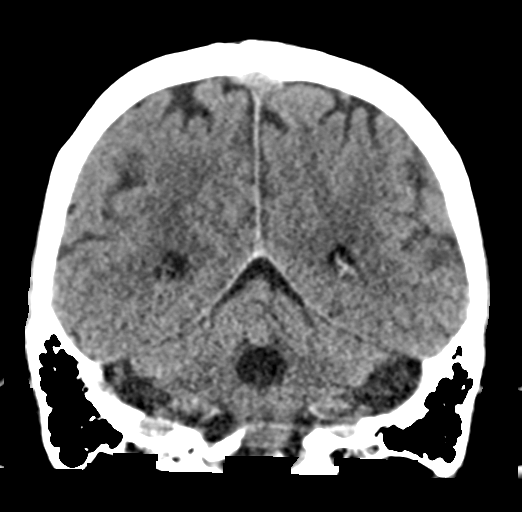
[im 30/67  brain]
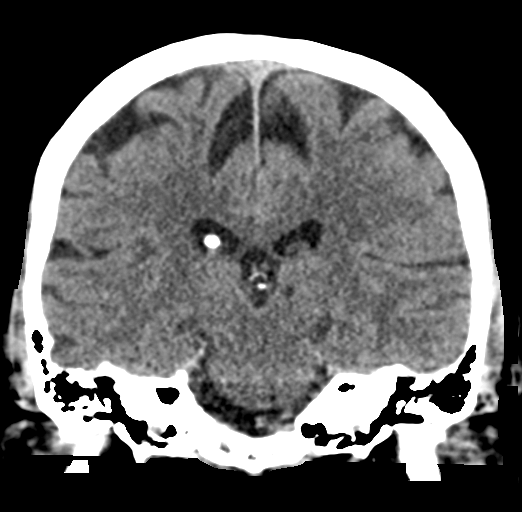
[im 37/67  brain]
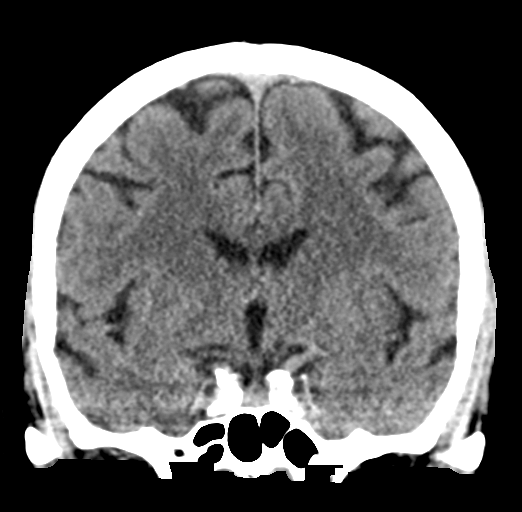

[Series 5: sagittal soft tissue · sagittal · 0.31mm/px · 3 of 54 slices shown]
[im 18/54  brain]
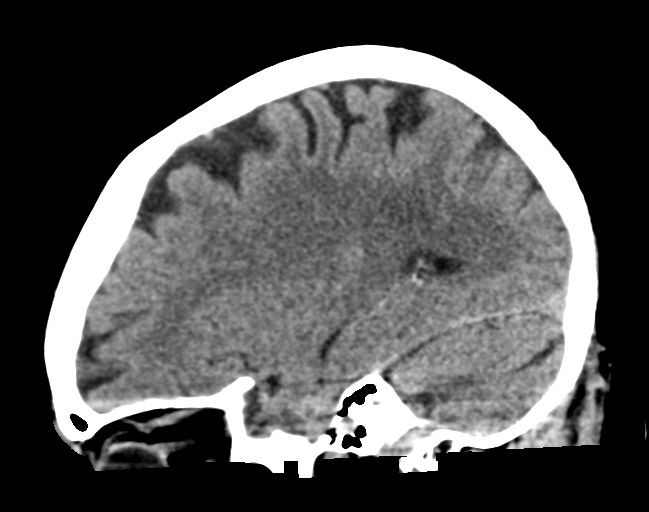
[im 27/54  brain]
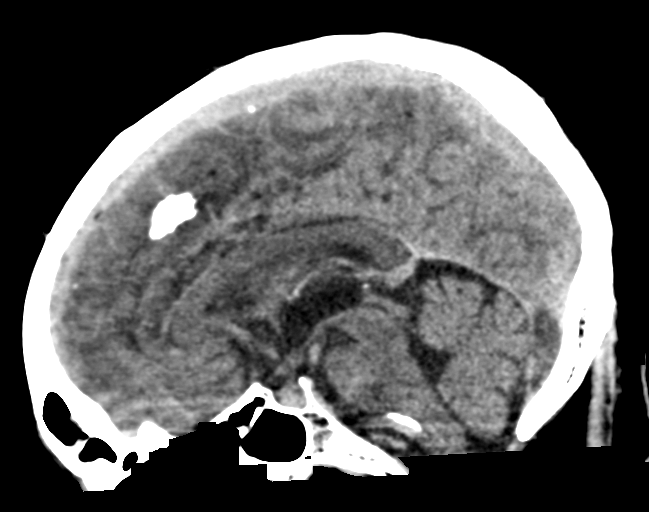
[im 36/54  brain]
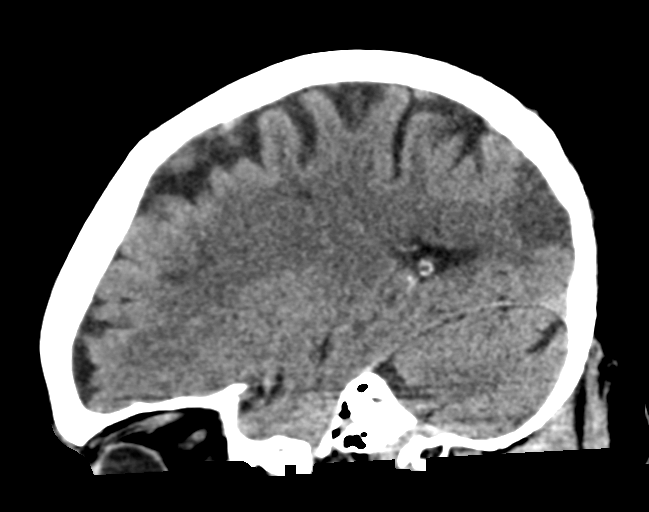

[16 of 47 positions shown; findings below may reference images not displayed]

FINDINGS: Brain: Ventricles and sulci are normal in size and configuration.
There is no intracranial mass, hemorrhage, extra-axial fluid
collection, midline shift. There is decreased attenuation in the mid
left frontal lobe consistent with a recent and potentially acute
infarct. A similar appearing area of decreased attenuation is noted
in the mid left occipital lobe, concerning for recent and
potentially acute infarct. There is a focus of decreased attenuation
in the posterior right centrum semiovale which may represent a
recent and possibly acute white matter infarct in this area.

Vascular: No hyperdense vessel. There is calcification in each
distal vertebral artery and carotid siphon region.

Skull: Bony calvarium appears intact.

Sinuses/Orbits: Mucosal thickening noted in several ethmoid air
cells and in the anterior sphenoid sinus regions. Visualized orbits
appear symmetric bilaterally.

Other: Mastoid air cells are clear.
IMPRESSION: Concern for acute infarcts in the mid left frontal, mid left
occipital, and superior posterior right centrum semiovale. This
distribution of potential acute infarcts suggests underlying embolic
phenomenon. No mass or hemorrhage evident.

Multiple foci of arterial vascular calcification noted. There is
mucosal thickening in several paranasal sinus regions.

## 2022-11-21 ENCOUNTER — Ambulatory Visit (INDEPENDENT_AMBULATORY_CARE_PROVIDER_SITE_OTHER): Payer: 59 | Admitting: Podiatry

## 2022-11-21 DIAGNOSIS — Z91199 Patient's noncompliance with other medical treatment and regimen due to unspecified reason: Secondary | ICD-10-CM

## 2022-11-21 NOTE — Addendum Note (Signed)
Addended by: Freddie Breech on: 11/21/2022 11:35 AM   Modules accepted: Level of Service

## 2022-11-21 NOTE — Progress Notes (Signed)
1. No-show for appointment     

## 2023-05-12 DIAGNOSIS — I739 Peripheral vascular disease, unspecified: Secondary | ICD-10-CM | POA: Diagnosis not present

## 2023-05-12 DIAGNOSIS — E119 Type 2 diabetes mellitus without complications: Secondary | ICD-10-CM | POA: Diagnosis not present

## 2023-05-30 DIAGNOSIS — H905 Unspecified sensorineural hearing loss: Secondary | ICD-10-CM | POA: Diagnosis not present

## 2023-07-10 DIAGNOSIS — I739 Peripheral vascular disease, unspecified: Secondary | ICD-10-CM | POA: Diagnosis not present

## 2023-07-10 DIAGNOSIS — E119 Type 2 diabetes mellitus without complications: Secondary | ICD-10-CM | POA: Diagnosis not present

## 2023-07-10 DIAGNOSIS — Z7185 Encounter for immunization safety counseling: Secondary | ICD-10-CM | POA: Diagnosis not present

## 2023-07-10 DIAGNOSIS — I10 Essential (primary) hypertension: Secondary | ICD-10-CM | POA: Diagnosis not present

## 2023-07-10 DIAGNOSIS — E7800 Pure hypercholesterolemia, unspecified: Secondary | ICD-10-CM | POA: Diagnosis not present

## 2023-07-10 DIAGNOSIS — Z23 Encounter for immunization: Secondary | ICD-10-CM | POA: Diagnosis not present

## 2023-07-10 DIAGNOSIS — E1151 Type 2 diabetes mellitus with diabetic peripheral angiopathy without gangrene: Secondary | ICD-10-CM | POA: Diagnosis not present

## 2023-07-17 ENCOUNTER — Encounter: Payer: Self-pay | Admitting: Podiatry

## 2023-07-17 ENCOUNTER — Ambulatory Visit (INDEPENDENT_AMBULATORY_CARE_PROVIDER_SITE_OTHER): Admitting: Podiatry

## 2023-07-17 DIAGNOSIS — E1151 Type 2 diabetes mellitus with diabetic peripheral angiopathy without gangrene: Secondary | ICD-10-CM

## 2023-07-17 DIAGNOSIS — L84 Corns and callosities: Secondary | ICD-10-CM

## 2023-07-17 DIAGNOSIS — M79674 Pain in right toe(s): Secondary | ICD-10-CM

## 2023-07-17 DIAGNOSIS — M79675 Pain in left toe(s): Secondary | ICD-10-CM

## 2023-07-17 DIAGNOSIS — B351 Tinea unguium: Secondary | ICD-10-CM | POA: Diagnosis not present

## 2023-07-17 NOTE — Progress Notes (Signed)
  Subjective:  Patient ID: Micheal Owens, male    DOB: [birth year],  MRN: [id number]  "I can't wait to go fishing."  Micheal Owens presents to clinic today for at risk foot care. Pt has h/o NIDDM with PAD and callus(es) of both feet and painful mycotic toenails that are difficult to trim. Painful toenails interfere with ambulation. Aggravating factors include wearing enclosed shoe gear. Pain is relieved with periodic professional debridement. Painful calluses are aggravated when weightbearing with and without shoegear. Pain is relieved with periodic professional debridement.   New problem(s): None.   PCP is Bernita Raisin, MD.  No Known Allergies  Review of Systems: Negative except as noted in the HPI.  Objective: No changes noted in today's physical examination. There were no vitals filed for this visit. Micheal Owens is a pleasant 73 y.o. male WD, WN in NAD. AAO x 3.  Vascular Examination: CFT <4 seconds b/l. DP pulses diminished b/l. PT pulses diminished b/l. Digital hair absent. Skin temperature gradient warm to cool b/l. No ischemia or gangrene. No cyanosis or clubbing noted b/l.    Neurological Examination: Sensation grossly intact b/l with 10 gram monofilament. Vibratory sensation intact b/l.   Dermatological Examination: Pedal skin thin, shiny and atrophic b/l. No open wounds. No interdigital macerations.   Toenails 1-5 b/l thick, discolored, elongated with subungual debris and pain on dorsal palpation.   Hyperkeratotic lesion(s) submet head 1 right foot, submet head 5 left foot, and submet head 5 right foot.  No erythema, no edema, no drainage, no fluctuance.  Musculoskeletal Examination: Muscle strength 5/5 to all lower extremity muscle groups bilaterally. Pes planus deformity noted bilateral LE.  Radiographs: None  Assessment/Plan: 1. Pain due to onychomycosis of toenails of both feet   2. Callus   3. Type II diabetes mellitus with peripheral circulatory disorder  [facility])    Consent given for treatment. Patient examined.All patient's and/or POA's questions/concerns addressed on today's visit. Toenails 1-5 debrided in length and girth without incident. Callus(es) submet head 1 right foot, submet head 5 left foot, and submet head 5 right foot pared with sharp debridement without incident. Continue foot and shoe inspections daily. Monitor blood glucose per PCP/Endocrinologist's recommendations.Continue soft, supportive shoe gear daily. Report any pedal injuries to medical professional. Call office if there are any questions/concerns. -Patient/POA to call should there be question/concern in the interim.   Return in about 3 months (around 10/16/2023).  Micheal Owens, DPM      [facility] LOCATION: [address] [place] [address]                   Office [phone]   [facility] LOCATION: [address] [place], [place] [address] Office [phone]

## 2023-08-18 DIAGNOSIS — I10 Essential (primary) hypertension: Secondary | ICD-10-CM | POA: Diagnosis not present

## 2023-08-18 DIAGNOSIS — N369 Urethral disorder, unspecified: Secondary | ICD-10-CM | POA: Diagnosis not present

## 2023-08-18 DIAGNOSIS — E785 Hyperlipidemia, unspecified: Secondary | ICD-10-CM | POA: Diagnosis not present

## 2023-08-18 DIAGNOSIS — Z7982 Long term (current) use of aspirin: Secondary | ICD-10-CM | POA: Diagnosis not present

## 2023-10-17 ENCOUNTER — Ambulatory Visit (INDEPENDENT_AMBULATORY_CARE_PROVIDER_SITE_OTHER): Admitting: Podiatry

## 2023-10-17 DIAGNOSIS — Z91199 Patient's noncompliance with other medical treatment and regimen due to unspecified reason: Secondary | ICD-10-CM

## 2023-10-17 NOTE — Progress Notes (Signed)
 1. No-show for appointment

## 2023-12-22 DIAGNOSIS — Z79899 Other long term (current) drug therapy: Secondary | ICD-10-CM | POA: Diagnosis not present

## 2023-12-22 DIAGNOSIS — E785 Hyperlipidemia, unspecified: Secondary | ICD-10-CM | POA: Diagnosis not present

## 2023-12-22 DIAGNOSIS — Z87891 Personal history of nicotine dependence: Secondary | ICD-10-CM | POA: Diagnosis not present

## 2023-12-22 DIAGNOSIS — Z7982 Long term (current) use of aspirin: Secondary | ICD-10-CM | POA: Diagnosis not present

## 2023-12-22 DIAGNOSIS — I10 Essential (primary) hypertension: Secondary | ICD-10-CM | POA: Diagnosis not present

## 2023-12-22 DIAGNOSIS — H9193 Unspecified hearing loss, bilateral: Secondary | ICD-10-CM | POA: Diagnosis not present

## 2023-12-22 DIAGNOSIS — Z974 Presence of external hearing-aid: Secondary | ICD-10-CM | POA: Diagnosis not present
